# Patient Record
Sex: Male | Born: 1935 | Race: White | Hispanic: No | Marital: Married | Smoking: Former smoker
Health system: Southern US, Community
[De-identification: ages and names within clinical notes are randomized; demographics above are authoritative.]

## PROBLEM LIST (undated history)

## (undated) DIAGNOSIS — J449 Chronic obstructive pulmonary disease, unspecified: Secondary | ICD-10-CM

## (undated) DIAGNOSIS — J439 Emphysema, unspecified: Secondary | ICD-10-CM

## (undated) DIAGNOSIS — M5430 Sciatica, unspecified side: Secondary | ICD-10-CM

## (undated) DIAGNOSIS — I4891 Unspecified atrial fibrillation: Secondary | ICD-10-CM

## (undated) HISTORY — PX: OTHER SURGICAL HISTORY: SHX169

## (undated) HISTORY — PX: NEPHRECTOMY: SHX65

---

## 2013-12-07 ENCOUNTER — Emergency Department: Payer: Self-pay | Admitting: Emergency Medicine

## 2013-12-07 LAB — URINALYSIS, COMPLETE
Bilirubin,UR: NEGATIVE
Blood: NEGATIVE
Glucose,UR: NEGATIVE mg/dL (ref 0–75)
Nitrite: NEGATIVE
Ph: 5 (ref 4.5–8.0)
Protein: 30
RBC,UR: NONE SEEN /HPF (ref 0–5)
Specific Gravity: 1.015 (ref 1.003–1.030)
Squamous Epithelial: <1
WBC UR: 10 /HPF (ref 0–5)

## 2013-12-07 LAB — CBC WITH DIFFERENTIAL/PLATELET
BASOS ABS: 0 10*3/uL (ref 0.0–0.1)
BASOS PCT: 0.3 %
Eosinophil #: 0.2 10*3/uL (ref 0.0–0.7)
Eosinophil %: 1.5 %
HCT: 42.2 % (ref 40.0–52.0)
HGB: 13.7 g/dL (ref 13.0–18.0)
LYMPHS PCT: 12.6 %
Lymphocyte #: 1.4 10*3/uL (ref 1.0–3.6)
MCH: 33 pg (ref 26.0–34.0)
MCHC: 32.5 g/dL (ref 32.0–36.0)
MCV: 101 fL — ABNORMAL HIGH (ref 80–100)
Monocyte #: 1.5 x10 3/mm — ABNORMAL HIGH (ref 0.2–1.0)
Monocyte %: 13.1 %
Neutrophil #: 8.3 10*3/uL — ABNORMAL HIGH (ref 1.4–6.5)
Neutrophil %: 72.5 %
Platelet: 240 10*3/uL (ref 150–440)
RBC: 4.16 10*6/uL — ABNORMAL LOW (ref 4.40–5.90)
RDW: 17.8 % — ABNORMAL HIGH (ref 11.5–14.5)
WBC: 11.5 10*3/uL — ABNORMAL HIGH (ref 3.8–10.6)

## 2013-12-07 LAB — BASIC METABOLIC PANEL
ANION GAP: 10 (ref 7–16)
BUN: 25 mg/dL — ABNORMAL HIGH (ref 7–18)
CHLORIDE: 103 mmol/L (ref 98–107)
Calcium, Total: 9.8 mg/dL (ref 8.5–10.1)
Co2: 34 mmol/L — ABNORMAL HIGH (ref 21–32)
Creatinine: 1.1 mg/dL (ref 0.60–1.30)
EGFR (African American): >60
GLUCOSE: 90 mg/dL (ref 65–99)
Osmolality: 296 (ref 275–301)
Potassium: 4.4 mmol/L (ref 3.5–5.1)
SODIUM: 147 mmol/L — AB (ref 136–145)

## 2014-01-28 ENCOUNTER — Emergency Department: Payer: Self-pay | Admitting: Emergency Medicine

## 2015-01-06 ENCOUNTER — Encounter: Payer: Self-pay | Admitting: Emergency Medicine

## 2015-01-06 ENCOUNTER — Inpatient Hospital Stay
Admission: EM | Admit: 2015-01-06 | Discharge: 2015-01-08 | DRG: 871 | Disposition: A | Discharge status: Home Health | Payer: Medicare Other | Attending: Internal Medicine | Admitting: Internal Medicine

## 2015-01-06 ENCOUNTER — Emergency Department: Payer: Medicare Other

## 2015-01-06 DIAGNOSIS — J440 Chronic obstructive pulmonary disease with acute lower respiratory infection: Secondary | ICD-10-CM | POA: Diagnosis present

## 2015-01-06 DIAGNOSIS — I4891 Unspecified atrial fibrillation: Secondary | ICD-10-CM

## 2015-01-06 DIAGNOSIS — M5430 Sciatica, unspecified side: Secondary | ICD-10-CM | POA: Diagnosis present

## 2015-01-06 DIAGNOSIS — F0391 Unspecified dementia with behavioral disturbance: Secondary | ICD-10-CM | POA: Diagnosis present

## 2015-01-06 DIAGNOSIS — A419 Sepsis, unspecified organism: Secondary | ICD-10-CM | POA: Diagnosis present

## 2015-01-06 DIAGNOSIS — Z87891 Personal history of nicotine dependence: Secondary | ICD-10-CM | POA: Diagnosis not present

## 2015-01-06 DIAGNOSIS — D72829 Elevated white blood cell count, unspecified: Secondary | ICD-10-CM

## 2015-01-06 DIAGNOSIS — J181 Lobar pneumonia, unspecified organism: Secondary | ICD-10-CM

## 2015-01-06 DIAGNOSIS — Z79899 Other long term (current) drug therapy: Secondary | ICD-10-CM | POA: Diagnosis not present

## 2015-01-06 DIAGNOSIS — J189 Pneumonia, unspecified organism: Secondary | ICD-10-CM

## 2015-01-06 DIAGNOSIS — J439 Emphysema, unspecified: Secondary | ICD-10-CM

## 2015-01-06 DIAGNOSIS — R1310 Dysphagia, unspecified: Secondary | ICD-10-CM

## 2015-01-06 DIAGNOSIS — D649 Anemia, unspecified: Secondary | ICD-10-CM | POA: Diagnosis present

## 2015-01-06 DIAGNOSIS — F03918 Unspecified dementia, unspecified severity, with other behavioral disturbance: Secondary | ICD-10-CM

## 2015-01-06 HISTORY — DX: Unspecified atrial fibrillation: I48.91

## 2015-01-06 HISTORY — DX: Emphysema, unspecified: J43.9

## 2015-01-06 HISTORY — DX: Chronic obstructive pulmonary disease, unspecified: J44.9

## 2015-01-06 HISTORY — DX: Sciatica, unspecified side: M54.30

## 2015-01-06 LAB — URINALYSIS COMPLETE WITH MICROSCOPIC (ARMC ONLY)
Bacteria, UA: NONE SEEN
Bilirubin Urine: NEGATIVE
Glucose, UA: NEGATIVE mg/dL
HGB URINE DIPSTICK: NEGATIVE
KETONES UR: NEGATIVE mg/dL
LEUKOCYTES UA: NEGATIVE
NITRITE: NEGATIVE
PROTEIN: 100 mg/dL — AB
SPECIFIC GRAVITY, URINE: 1.017 (ref 1.005–1.030)
Squamous Epithelial / LPF: NONE SEEN
pH: 6 (ref 5.0–8.0)

## 2015-01-06 LAB — COMPREHENSIVE METABOLIC PANEL
ALT: 17 U/L (ref 17–63)
ANION GAP: 6 (ref 5–15)
AST: 28 U/L (ref 15–41)
Albumin: 3.7 g/dL (ref 3.5–5.0)
Alkaline Phosphatase: 59 U/L (ref 38–126)
BUN: 23 mg/dL — ABNORMAL HIGH (ref 6–20)
CALCIUM: 8.6 mg/dL — AB (ref 8.9–10.3)
CO2: 28 mmol/L (ref 22–32)
CREATININE: 0.99 mg/dL (ref 0.61–1.24)
Chloride: 103 mmol/L (ref 101–111)
Glucose, Bld: 101 mg/dL — ABNORMAL HIGH (ref 65–99)
Potassium: 3.8 mmol/L (ref 3.5–5.1)
Sodium: 137 mmol/L (ref 135–145)
Total Bilirubin: 0.7 mg/dL (ref 0.3–1.2)
Total Protein: 7.1 g/dL (ref 6.5–8.1)

## 2015-01-06 LAB — CBC WITH DIFFERENTIAL/PLATELET
BASOS ABS: 0 10*3/uL (ref 0–0.1)
BASOS PCT: 0 %
EOS ABS: 0 10*3/uL (ref 0–0.7)
EOS PCT: 0 %
HCT: 37.7 % — ABNORMAL LOW (ref 40.0–52.0)
HEMOGLOBIN: 12.6 g/dL — AB (ref 13.0–18.0)
Lymphocytes Relative: 6 %
Lymphs Abs: 0.7 10*3/uL — ABNORMAL LOW (ref 1.0–3.6)
MCH: 33.8 pg (ref 26.0–34.0)
MCHC: 33.4 g/dL (ref 32.0–36.0)
MCV: 101.3 fL — ABNORMAL HIGH (ref 80.0–100.0)
Monocytes Absolute: 1.7 10*3/uL — ABNORMAL HIGH (ref 0.2–1.0)
Monocytes Relative: 13 %
NEUTROS PCT: 81 %
Neutro Abs: 10.5 10*3/uL — ABNORMAL HIGH (ref 1.4–6.5)
PLATELETS: 171 10*3/uL (ref 150–440)
RBC: 3.72 MIL/uL — AB (ref 4.40–5.90)
RDW: 19 % — ABNORMAL HIGH (ref 11.5–14.5)
WBC: 13 10*3/uL — AB (ref 3.8–10.6)

## 2015-01-06 LAB — LACTIC ACID, PLASMA
Lactic Acid, Venous: 0.8 mmol/L (ref 0.5–2.0)
Lactic Acid, Venous: 0.4 mmol/L — ABNORMAL LOW (ref 0.5–2.0)

## 2015-01-06 LAB — TROPONIN I

## 2015-01-06 LAB — LIPASE, BLOOD: LIPASE: 35 U/L (ref 11–51)

## 2015-01-06 MED ORDER — SODIUM CHLORIDE 0.9 % IJ SOLN
3.0000 mL | Freq: Two times a day (BID) | INTRAMUSCULAR | Status: DC
  Administered 2015-01-06 – 2015-01-08 (×3): 3 mL via INTRAVENOUS

## 2015-01-06 MED ORDER — TRAMADOL HCL 50 MG PO TABS
50.0000 mg | ORAL_TABLET | Freq: Three times a day (TID) | ORAL | Status: DC | PRN
  Administered 2015-01-07 (×2): 50 mg via ORAL
  Filled 2015-01-06 (×2): qty 1

## 2015-01-06 MED ORDER — ENOXAPARIN SODIUM 40 MG/0.4ML ~~LOC~~ SOLN
40.0000 mg | SUBCUTANEOUS | Status: DC
  Administered 2015-01-06 – 2015-01-07 (×2): 40 mg via SUBCUTANEOUS
  Filled 2015-01-06 (×2): qty 0.4

## 2015-01-06 MED ORDER — DEXTROSE 5 % IV SOLN
1.0000 g | Freq: Every day | INTRAVENOUS | Status: DC
  Administered 2015-01-07 (×2): 1 g via INTRAVENOUS
  Filled 2015-01-06 (×3): qty 10

## 2015-01-06 MED ORDER — VITAMIN B-12 1000 MCG PO TABS
1000.0000 ug | ORAL_TABLET | Freq: Every day | ORAL | Status: DC
  Administered 2015-01-07 – 2015-01-08 (×2): 1000 ug via ORAL
  Filled 2015-01-06 (×2): qty 1

## 2015-01-06 MED ORDER — ADULT MULTIVITAMIN W/MINERALS CH
1.0000 | ORAL_TABLET | Freq: Every day | ORAL | Status: DC
  Administered 2015-01-07 – 2015-01-08 (×2): 1 via ORAL
  Filled 2015-01-06 (×2): qty 1

## 2015-01-06 MED ORDER — ACETAMINOPHEN 500 MG PO TABS
1000.0000 mg | ORAL_TABLET | ORAL | Status: AC
  Administered 2015-01-06: 1000 mg via ORAL
  Filled 2015-01-06: qty 2

## 2015-01-06 MED ORDER — DEXTROSE 5 % IV SOLN
500.0000 mg | INTRAVENOUS | Status: DC
  Administered 2015-01-06 – 2015-01-07 (×2): 500 mg via INTRAVENOUS
  Filled 2015-01-06 (×3): qty 500

## 2015-01-06 MED ORDER — DOCUSATE SODIUM 100 MG PO CAPS
100.0000 mg | ORAL_CAPSULE | Freq: Two times a day (BID) | ORAL | Status: DC | PRN
Start: 2015-01-06 — End: 2015-01-08

## 2015-01-06 MED ORDER — POLYETHYLENE GLYCOL 3350 17 G PO PACK: 17.0000 g | PACK | Freq: Every day | ORAL | Status: DC | PRN

## 2015-01-06 MED ORDER — GUAIFENESIN ER 600 MG PO TB12
600.0000 mg | ORAL_TABLET | Freq: Two times a day (BID) | ORAL | Status: DC
  Administered 2015-01-06 – 2015-01-08 (×4): 600 mg via ORAL
  Filled 2015-01-06 (×4): qty 1

## 2015-01-06 MED ORDER — ONDANSETRON HCL 4 MG PO TABS: 4.0000 mg | ORAL_TABLET | Freq: Four times a day (QID) | ORAL | Status: DC | PRN

## 2015-01-06 MED ORDER — PIPERACILLIN-TAZOBACTAM 3.375 G IVPB
3.3750 g | Freq: Once | INTRAVENOUS | Status: AC
  Administered 2015-01-06: 3.375 g via INTRAVENOUS
  Filled 2015-01-06: qty 50

## 2015-01-06 MED ORDER — TRAMADOL HCL 50 MG PO TABS
50.0000 mg | ORAL_TABLET | Freq: Every evening | ORAL | Status: DC | PRN
  Administered 2015-01-07: 50 mg via ORAL
  Filled 2015-01-06: qty 1

## 2015-01-06 MED ORDER — SODIUM CHLORIDE 0.9 % IV SOLN
INTRAVENOUS | Status: DC
  Administered 2015-01-06: 22:00:00 via INTRAVENOUS

## 2015-01-06 MED ORDER — SODIUM CHLORIDE 0.9 % IV BOLUS (SEPSIS)
1000.0000 mL | Freq: Once | INTRAVENOUS | Status: AC
  Administered 2015-01-06: 1000 mL via INTRAVENOUS

## 2015-01-06 MED ORDER — ACETAMINOPHEN 325 MG PO TABS: 650.0000 mg | ORAL_TABLET | Freq: Four times a day (QID) | ORAL | Status: DC | PRN

## 2015-01-06 MED ORDER — VANCOMYCIN HCL IN DEXTROSE 1-5 GM/200ML-% IV SOLN
1000.0000 mg | Freq: Once | INTRAVENOUS | Status: AC
  Administered 2015-01-06: 1000 mg via INTRAVENOUS
  Filled 2015-01-06: qty 200

## 2015-01-06 MED ORDER — IPRATROPIUM-ALBUTEROL 0.5-2.5 (3) MG/3ML IN SOLN: 3.0000 mL | Freq: Four times a day (QID) | RESPIRATORY_TRACT | Status: DC | PRN

## 2015-01-06 MED ORDER — ACETAMINOPHEN 650 MG RE SUPP: 650.0000 mg | Freq: Four times a day (QID) | RECTAL | Status: DC | PRN

## 2015-01-06 MED ORDER — TIOTROPIUM BROMIDE MONOHYDRATE 18 MCG IN CAPS
18.0000 ug | ORAL_CAPSULE | Freq: Every day | RESPIRATORY_TRACT | Status: DC
  Administered 2015-01-07 – 2015-01-08 (×2): 18 ug via RESPIRATORY_TRACT
  Filled 2015-01-06: qty 5

## 2015-01-06 MED ORDER — ONDANSETRON HCL 4 MG/2ML IJ SOLN: 4.0000 mg | Freq: Four times a day (QID) | INTRAMUSCULAR | Status: DC | PRN

## 2015-01-06 NOTE — ED Provider Notes (Signed)
[facility] Emergency Department Provider Note  REMINDER - THIS NOTE IS NOT A FINAL MEDICAL RECORD UNTIL IT IS SIGNED. UNTIL THEN, THE CONTENT BELOW MAY REFLECT INFORMATION FROM A DOCUMENTATION TEMPLATE, NOT THE ACTUAL PATIENT VISIT. ____________________________________________  Time seen: Approximately 6:12 PM  I have reviewed the triage vital signs and the nursing notes.   HISTORY  Chief Complaint Fever   HPI Donald Blanchard is a 82 y.o. male history of COPD, sciatica,presents today for feeling very weak. Patient reports that he's had a cough and is been difficult to stand for about the last day. She feels very dry and "dehydrated. He was told he had a fever and was sent failure further evaluation.  He denies any pain.  Past Medical History  Diagnosis Date  . COPD (chronic obstructive pulmonary disease) (HCC)   . Emphysema of lung (HCC)   . Sciatica   . Emphysema lung (HCC)     There are no active problems to display for this patient.   Past Surgical History  Procedure Laterality Date  . Lobectomy      Current Outpatient Rx  Name  Route  Sig  Dispense  Refill  . docusate sodium (COLACE) 100 MG capsule   Oral   Take 100 mg by mouth every 12 (twelve) hours as needed for mild constipation or moderate constipation.         . Multiple Vitamins-Minerals (MULTIVITAMIN WITH MINERALS) tablet   Oral   Take 1 tablet by mouth daily.         . polyethylene glycol (MIRALAX / GLYCOLAX) packet   Oral   Take 17 g by mouth daily as needed for mild constipation, moderate constipation or severe constipation. Mix in 8 oz water         . tiotropium (SPIRIVA) 18 MCG inhalation capsule   Inhalation   Place 18 mcg into inhaler and inhale daily.         . traMADol (ULTRAM) 50 MG tablet   Oral   Take 50 mg by mouth 3 (three) times daily.         . vitamin B-12 (CYANOCOBALAMIN) 1000 MCG tablet   Oral   Take 1,000 mcg by mouth daily.            Allergies Review of patient's allergies indicates no known allergies.  No family history on file.  Social History Social History  Substance Use Topics  . Smoking status: Former Smoker    Quit date: 01/02/2004  . Smokeless tobacco: None  . Alcohol Use: No    Review of Systems Constitutional: See history of present illness Eyes: No visual changes. ENT: No sore throat. Cardiovascular: Denies chest pain. Respiratory: Productive Gastrointestinal: No abdominal pain.  No nausea, no vomiting.  No diarrhea.  No constipation. Genitourinary: Negative for dysuria. Musculoskeletal: Negative for back pain. Skin: Negative for rash. Neurological: Negative for headaches, focal weakness or numbness.  10-point ROS otherwise negative.  ____________________________________________   PHYSICAL EXAM:  VITAL SIGNS: ED Triage Vitals  Enc Vitals Group     BP --      Pulse Rate 01/06/15 1648 70     Resp 01/06/15 1648 20     Temp 01/06/15 1714 101.2 F (38.4 C)     Temp Source 01/06/15 1714 Rectal     SpO2 01/06/15 1648 96 %     Weight 01/06/15 1648 135 lb (61.236 kg)     Height 01/06/15 1648 6\' 3"  (1.905 m)  Head Cir --      Peak Flow --      Pain Score --      Pain Loc --      Pain Edu? --      Excl. in GC? --    Constitutional: Alert and oriented. Mildly fatigued appearing but in no distress. Eyes: Conjunctivae are normal. PERRL. EOMI. Head: Atraumatic. Nose: No congestion/rhinnorhea. Mouth/Throat: Mucous membranes are moist.  Oropharynx non-erythematous. Neck: No stridor.   Cardiovascular: Normal rate, regular rhythm. Grossly normal heart sounds.  Good peripheral circulation. Respiratory: Normal respiratory effort.  No retractions. Lungs CTAB. Occasional dry cough. Gastrointestinal: Soft and nontender. No distention. No abdominal bruits. No CVA tenderness. Musculoskeletal: No lower extremity tenderness does have 2+ lower extremity edema bilateral.  No joint  effusions. Neurologic:  Normal speech and language. No gross focal neurologic deficits are appreciated. No gait instability. Skin:  Skin is warm, dry and intact. No rash noted. Psychiatric: Mood and affect are normal. Speech and behavior are normal.  ____________________________________________   LABS (all labs ordered are listed, but only abnormal results are displayed)  Labs Reviewed  CBC WITH DIFFERENTIAL/PLATELET - Abnormal; Notable for the following:    WBC 13.0 (*)    RBC 3.72 (*)    Hemoglobin 12.6 (*)    HCT 37.7 (*)    MCV 101.3 (*)    RDW 19.0 (*)    Neutro Abs 10.5 (*)    Lymphs Abs 0.7 (*)    Monocytes Absolute 1.7 (*)    All other components within normal limits  URINALYSIS COMPLETEWITH MICROSCOPIC ([facility] ONLY) - Abnormal; Notable for the following:    Color, Urine YELLOW (*)    APPearance HAZY (*)    Protein, ur 100 (*)    All other components within normal limits  COMPREHENSIVE METABOLIC PANEL - Abnormal; Notable for the following:    Glucose, Bld 101 (*)    BUN 23 (*)    Calcium 8.6 (*)    All other components within normal limits  CULTURE, BLOOD (ROUTINE X 2)  CULTURE, BLOOD (ROUTINE X 2)  LIPASE, BLOOD  TROPONIN I  LACTIC ACID, PLASMA  LACTIC ACID, PLASMA   ____________________________________________  EKG  Reviewed and interpreted by me at 1700 Normal sinus rhythm, heart rate 70 QRS 150 QTc 450 Normal sinus rhythm, occasional PVC with sinus pause. Old Q waves seen in anteroseptal distribution. There is minimal J-point elevation noted inferiorly, no morphology or changes to suggest acute ST elevation  The patient is chest pain-free at the time of this EKG. ____________________________________________  RADIOLOGY  DG Chest 2 View (Final result) Result time: 01/06/15 17:48:53   Final result by Rad Results In Interface (01/06/15 17:48:53)   Narrative:   CLINICAL DATA: 82 year old nursing home patient with acute onset of fever. Current  history of emphysema.  EXAM: CHEST 2 VIEW  COMPARISON: None.  FINDINGS: Cardiac silhouette normal in size. Thoracic aorta atherosclerotic and mildly tortuous. Hilar and mediastinal contours otherwise unremarkable. Lungs hyperinflated with diffuse emphysematous changes and likely interstitial fibrosis in the lower lobes and lingula. Patchy airspace opacities in the right upper lobe scattered calcified granulomata in both lungs. Degenerative changes involving the thoracic spine.  IMPRESSION: Acute right upper lobe pneumonia superimposed upon severe COPD/emphysema.   Electronically Signed By: Hulan Saas M.D. On: 01/06/2015 17:48       ____________________________________________   PROCEDURES  Procedure(s) performed: None  Critical Care performed: Yes, see critical care note(s)  ____________________________________________   INITIAL IMPRESSION /  ASSESSMENT AND PLAN / ED COURSE  Pertinent labs & imaging results that were available during my care of the patient were reviewed by me and considered in my medical decision making (see chart for details).  Patient presents with a fever, weakness, and cough. Chest x-ray appears to show an infiltrate in the right lower lobe. There is mention of a prior lobectomy, however patient does not recall where this was performed which lung. Based on his fever, leukocytosis, and cough I am concerned about sepsis with a pulmonary source be most likely. Initiate sepsis protocol. We will admit to the hospital. Wide spectrum antibiotics.  CRITICAL CARE Performed by: Sharyn Creamer   Total critical care time: 35 minutes  Critical care time was exclusive of separately billable procedures and treating other patients.  Critical care was necessary to treat or prevent imminent or life-threatening deterioration.  Critical care was time spent personally by me on the following activities: development of treatment plan with patient and/or  surrogate as well as nursing, discussions with consultants, evaluation of patient's response to treatment, examination of patient, obtaining history from patient or surrogate, ordering and performing treatments and interventions, ordering and review of laboratory studies, ordering and review of radiographic studies, pulse oximetry and re-evaluation of patient's condition.  Critical care includes early goal-directed therapy for acute sepsis.   Called received from patient's son-in-law, updated on plan of care. ____________________________________________   FINAL CLINICAL IMPRESSION(S) / ED DIAGNOSES  Final diagnoses:  Right upper lobe pneumonia  Sepsis, due to unspecified organism [facility])      Sharyn Creamer, MD 01/06/15 1940

## 2015-01-06 NOTE — ED Notes (Signed)
Pt brought in via EMS from [facility]; EMS reports MD was doing rounds and pt had fever.  EMS reports temporal fever of 101.7.  Pt A/O to self, location, reports that he feels "fine" but is weaker that normal.  Vitals WDL, no immediate distress at this time.

## 2015-01-06 NOTE — Progress Notes (Signed)
ANTIBIOTIC CONSULT NOTE - INITIAL  Pharmacy Consult for ceftriaxone Indication: pneumonia  No Known Allergies  Patient Measurements: Height: 6\' 3"  (190.5 cm) Weight: 135 lb (61.236 kg) IBW/kg (Calculated) : 84.5 Adjusted Body Weight:   Vital Signs: Temp: 97.9 F (36.6 C) (01/05 2210) Temp Source: Oral (01/05 2210) BP: 140/49 mmHg (01/05 2210) Pulse Rate: 55 (01/05 2210) Intake/Output from previous day:   Intake/Output from this shift:    Labs:  Recent Labs  01/06/15 1740  WBC 13.0*  HGB 12.6*  PLT 171  CREATININE 0.99   Estimated Creatinine Clearance: 49.8 mL/min (by C-G formula based on Cr of 0.99). No results for input(s): VANCOTROUGH, VANCOPEAK, VANCORANDOM, GENTTROUGH, GENTPEAK, GENTRANDOM, TOBRATROUGH, TOBRAPEAK, TOBRARND, AMIKACINPEAK, AMIKACINTROU, AMIKACIN in the last 72 hours.   Microbiology: No results found for this or any previous visit (from the past 720 hour(s)).  Medical History: Past Medical History  Diagnosis Date  . COPD (chronic obstructive pulmonary disease) (HCC)     not on home o2  . Emphysema of lung (HCC)   . Sciatica   . A-fib (HCC)     Medications:  Infusions:  . sodium chloride     Assessment: 84 yom cc fever with history of COPD/bullous emphysema. From ALF secondary to fever, cough, confusion. Pharmacy consulted to dose ceftriaxone for CAP.  Goal of Therapy:    Plan:  Ceftriaxone 1 gm IV Q24H. Pharmacy will continue to follow for duration and and conversion to PO.  Carola FrostNathan A Summit Arroyave, Pharm.D., BCPS Clinical Pharmacist 01/06/2015,10:14 PM

## 2015-01-06 NOTE — H&P (Addendum)
[facility] [place] [facility]   PATIENT NAME: Donald Blanchard    MR#:  [id number]  DATE OF BIRTH:  05-30-1930  DATE OF ADMISSION:  01/06/2015  PRIMARY CARE PHYSICIAN: Doctors making house calls  REQUESTING/REFERRING PHYSICIAN: Dr. Sharyn Creamer  CHIEF COMPLAINT:   Chief Complaint  Patient presents with  . Fever    HISTORY OF PRESENT ILLNESS:  Donald Blanchard  is a 82 y.o. male with a known history of COPD and bullous emphysema, prior history of smoking, sciatica with weakness in both lower extremities presents from assisted living facility secondary to fevers, cough and confusion. Patient lives at the assisted living facility with his wife who has dementia. He ambulates with the help of a walker due to his sciatica pain in both lower extremities. He was noticed to have trouble breathing and also coughing today. O2 to be more disoriented and also was running a fever at the facility. So he was sent to the emergency room. Patient denies any fevers or chills. He notices that he is much weaker than normal and unable to even walk today. Has an elevated white count. Chest x-ray with right upper lobe infiltrate.  PAST MEDICAL HISTORY:   Past Medical History  Diagnosis Date  . COPD (chronic obstructive pulmonary disease) (HCC)     not on home o2  . Emphysema of lung (HCC)   . Sciatica   . A-fib [facility])     PAST SURGICAL HISTORY:   Past Surgical History  Procedure Laterality Date  . Right lung lesion removal    . Nephrectomy      Left- secondary to trauma    SOCIAL HISTORY:   Social History  Substance Use Topics  . Smoking status: Former Smoker    Quit date: 01/02/2004  . Smokeless tobacco: Not on file     Comment: Quit 10 years ago  . Alcohol Use: No    FAMILY HISTORY:   Family History  Problem Relation Age of Onset  . Hypertension Father     DRUG ALLERGIES:  No Known Allergies  REVIEW OF SYSTEMS:   Review of Systems  Constitutional:  Positive for fever and malaise/fatigue. Negative for chills and weight loss.  HENT: Negative for ear discharge, ear pain, nosebleeds and tinnitus.   Eyes: Negative for blurred vision, double vision and photophobia.  Respiratory: Positive for cough and shortness of breath. Negative for hemoptysis and wheezing.   Cardiovascular: Negative for chest pain, palpitations, orthopnea and leg swelling.  Gastrointestinal: Negative for nausea, vomiting, abdominal pain, diarrhea, constipation and melena.  Genitourinary: Negative for dysuria, urgency, frequency and hematuria.  Musculoskeletal: Negative for myalgias, back pain and neck pain.  Skin: Negative for rash.  Neurological: Negative for dizziness, tremors, sensory change, speech change, focal weakness and headaches.       Some confusion today  Endo/Heme/Allergies: Does not bruise/bleed easily.  Psychiatric/Behavioral: Negative for depression.    MEDICATIONS AT HOME:   Prior to Admission medications   Medication Sig Start Date End Date Taking? Authorizing Provider  docusate sodium (COLACE) 100 MG capsule Take 100 mg by mouth every 12 (twelve) hours as needed for mild constipation or moderate constipation.   Yes Historical Provider, MD  Multiple Vitamins-Minerals (MULTIVITAMIN WITH MINERALS) tablet Take 1 tablet by mouth daily.   Yes Historical Provider, MD  polyethylene glycol (MIRALAX / GLYCOLAX) packet Take 17 g by mouth daily as needed for mild constipation, moderate constipation or severe constipation. Mix in 8 oz water  Yes Historical Provider, MD  tiotropium (SPIRIVA) 18 MCG inhalation capsule Place 18 mcg into inhaler and inhale daily.   Yes Historical Provider, MD  traMADol (ULTRAM) 50 MG tablet Take 50 mg by mouth 3 (three) times daily.   Yes Historical Provider, MD  vitamin B-12 (CYANOCOBALAMIN) 1000 MCG tablet Take 1,000 mcg by mouth daily.   Yes Historical Provider, MD      VITAL SIGNS:  Blood pressure 122/56, pulse 58, temperature  99.5 F (37.5 C), temperature source Rectal, resp. rate 14, height 6\' 3"  (1.905 m), weight 61.236 kg (135 lb), SpO2 98 %.  PHYSICAL EXAMINATION:   Physical Exam  GENERAL:  82 y.o.-year-old elderly patient sitting in the bed with no acute distress.  EYES: Pupils equal, round, reactive to light and accommodation. No scleral icterus. Extraocular muscles intact.  HEENT: Head atraumatic, normocephalic. Oropharynx and nasopharynx clear.  NECK:  Supple, no jugular venous distention. No thyroid enlargement, no tenderness.  LUNGS: Moving air bilaterally, occasional scant breath sounds. Scattered rhonchi on the right side. no wheezing, rales or crepitation. No use of accessory muscles of respiration.  CARDIOVASCULAR: S1, S2 normal. No rubs, or gallops. 3/6 systolic murmur is present. Irregular rhythm noted. ABDOMEN: Soft, nontender, nondistended. Bowel sounds present. No organomegaly or mass.  EXTREMITIES: No pedal edema, cyanosis, or clubbing.  NEUROLOGIC: Cranial nerves II through XII are intact. Muscle strength 5/5 in all extremities. Sensation intact. Gait not checked.  PSYCHIATRIC: The patient is alert and oriented x 2-3.  SKIN: No obvious rash, lesion, or ulcer.   LABORATORY PANEL:   CBC  Recent Labs Lab 01/06/15 1740  WBC 13.0*  HGB 12.6*  HCT 37.7*  PLT 171   ------------------------------------------------------------------------------------------------------------------  Chemistries   Recent Labs Lab 01/06/15 1740  NA 137  K 3.8  CL 103  CO2 28  GLUCOSE 101*  BUN 23*  CREATININE 0.99  CALCIUM 8.6*  AST 28  ALT 17  ALKPHOS 59  BILITOT 0.7   ------------------------------------------------------------------------------------------------------------------  Cardiac Enzymes  Recent Labs Lab 01/06/15 1740  TROPONINI <0.03   ------------------------------------------------------------------------------------------------------------------  RADIOLOGY:  Dg Chest 2  View  01/06/2015  CLINICAL DATA:  82 year old nursing home patient with acute onset of fever. Current history of emphysema. EXAM: CHEST  2 VIEW COMPARISON:  None. FINDINGS: Cardiac silhouette normal in size. Thoracic aorta atherosclerotic and mildly tortuous. Hilar and mediastinal contours otherwise unremarkable. Lungs hyperinflated with diffuse emphysematous changes and likely interstitial fibrosis in the lower lobes and lingula. Patchy airspace opacities in the right upper lobe scattered calcified granulomata in both lungs. Degenerative changes involving the thoracic spine. IMPRESSION: Acute right upper lobe pneumonia superimposed upon severe COPD/emphysema. Electronically Signed   By: Hulan Saas M.D.   On: 01/06/2015 17:48    EKG:   Orders placed or performed during the hospital encounter of 01/06/15  . ED EKG  . ED EKG    IMPRESSION AND PLAN:   Donald Blanchard  is a 82 y.o. male with a known history of COPD and bullous emphysema, prior history of smoking, sciatica with weakness in both lower extremities presents from assisted living facility secondary to fevers, cough and confusion.  #1 sepsis-secondary to community-acquired pneumonia. -Blood cultures ordered. -Start on Rocephin and azithromycin. -Currently not requiring any oxygen. Continue to monitor. - Mucinex added for cough  #2 COPD-stable at this time. Takes only Spiriva at home. Continue that.  #3 sciatica pain-continue tramadol as needed. -Physical therapy consult for weakness.  #4 DVT prophylaxis-on Lovenox  #5 A. fib-doesn't  take any medications at home. Rate controlled. -Not on any anticoagulation.    All the records are reviewed and case discussed with ED provider. Management plans discussed with the patient, family and they are in agreement.  CODE STATUS: Full Code  TOTAL TIME TAKING CARE OF THIS PATIENT: 50 minutes.    Enid BaasKALISETTI,Francetta Ilg M.D on 01/06/2015 at 8:32 PM  Between 7am to 6pm - Pager -  [phone]  After 6pm go to [email or web address] - password [email or web address] [facility]  [facility]  Office  [phone]  CC: Primary care physician; No primary care provider on file.

## 2015-01-07 LAB — CBC
HEMATOCRIT: 33 % — AB (ref 40.0–52.0)
Hemoglobin: 11 g/dL — ABNORMAL LOW (ref 13.0–18.0)
MCH: 33.3 pg (ref 26.0–34.0)
MCHC: 33.5 g/dL (ref 32.0–36.0)
MCV: 99.6 fL (ref 80.0–100.0)
Platelets: 153 10*3/uL (ref 150–440)
RBC: 3.31 MIL/uL — AB (ref 4.40–5.90)
RDW: 18.5 % — ABNORMAL HIGH (ref 11.5–14.5)
WBC: 9.3 10*3/uL (ref 3.8–10.6)

## 2015-01-07 LAB — BASIC METABOLIC PANEL
Anion gap: 5 (ref 5–15)
BUN: 19 mg/dL (ref 6–20)
CHLORIDE: 104 mmol/L (ref 101–111)
CO2: 26 mmol/L (ref 22–32)
Calcium: 8 mg/dL — ABNORMAL LOW (ref 8.9–10.3)
Creatinine, Ser: 0.61 mg/dL (ref 0.61–1.24)
GFR calc non Af Amer: >60 mL/min (ref >60)
Glucose, Bld: 77 mg/dL (ref 65–99)
POTASSIUM: 3.5 mmol/L (ref 3.5–5.1)
SODIUM: 135 mmol/L (ref 135–145)

## 2015-01-07 MED ORDER — HALOPERIDOL LACTATE 5 MG/ML IJ SOLN
2.5000 mg | INTRAMUSCULAR | Status: DC | PRN
  Administered 2015-01-07 – 2015-01-08 (×2): 2.5 mg via INTRAVENOUS
  Filled 2015-01-07 (×3): qty 1

## 2015-01-07 NOTE — Care Management Note (Signed)
Case Management Note  Patient Details  Name: Donald Blanchard MRN: [id number] Date of Birth: 09/30/1930  Subjective/Objective:            Patient suffers from neuropathy which impairs their ability to perform daily activities like toileting in the home. A walker will not resolve  issue with performing activities of daily living. A wheelchair will allow patient to safely perform daily activities. Patient can safely propel the wheelchair in the home or has a caregiver who can provide assistance.  Accessories: elevating leg rests (ELRs), wheel locks, extensions and anti-tippers.             Action/Plan:   Expected Discharge Date:  01/10/15               Expected Discharge Plan:     In-House Referral:     Discharge planning Services     Post Acute Care Choice:    Choice offered to:     DME Arranged:    DME Agency:     HH Arranged:    HH Agency:     Status of Service:     Medicare Important Message Given:  Yes Date Medicare IM Given:    Medicare IM give by:    Date Additional Medicare IM Given:    Additional Medicare Important Message give by:     If discussed at Long Length of Stay Meetings, dates discussed:    Additional Comments:  Tishawn Friedhoff A, RN 01/07/2015, 4:46 PM

## 2015-01-07 NOTE — Evaluation (Signed)
Physical Therapy Evaluation Patient Details Name: Collen Vincent MRN: [id number] DOB: 1930-06-20 Today's Date: 01/07/2015   History of Present Illness  Patient is an 82 y/o male that presents with R upper lobe pneumonia and sepsis. He has been having an increasingly difficult time ambulating secondary to feeling of weakness.   Clinical Impression  Patient demonstrates decreased bed mobility, sitting balance, transfers, and ambulation tolerance. He presents as a very high falls risk in this session and would benefit from short term rehabilitation to increase his strength and independence with mobility prior to return home. Patient demonstrates mild confusion and significant reduction in gait speed during this session. Significant kyphosis noted, which he states eases his long standing bilateral LE sciatic pain. No strength deficits noted in MMT, however he appears globally weak and deconditioned as he requires assistance with bed mobility and transfers and fatigues quickly. Skilled acute PT services are indicated to address the mobility deficits listed.     Follow Up Recommendations SNF    Equipment Recommendations       Recommendations for Other Services       Precautions / Restrictions Precautions Precautions: Fall Restrictions Weight Bearing Restrictions: No      Mobility  Bed Mobility Overal bed mobility: Needs Assistance Bed Mobility: Supine to Sit     Supine to sit: Min assist;Mod assist     General bed mobility comments: Patient requires cuing for hand placement and assistance with bringing LEs off the edge of the bed. Patient requires significant assistance with elevating trunk off the bed.   Transfers Overall transfer level: Needs assistance Equipment used: Rolling walker (2 wheeled) Transfers: Sit to/from Stand Sit to Stand: Min assist         General transfer comment: Patient requires cuing for hand placement, anterior trunk lean, and pushing through forefoot  (initially stands on heels and is off balance).   Ambulation/Gait Ambulation/Gait assistance: Min guard Ambulation Distance (Feet): 7 Feet Assistive device: Rolling walker (2 wheeled) Gait Pattern/deviations: Step-through pattern;Decreased step length - right;Decreased step length - left;Decreased dorsiflexion - right;Trunk flexed;Wide base of support   Gait velocity interpretation: Below normal speed for age/gender General Gait Details: Patient demonstrates significant trunkal flexion and wide base of support with requirements for cuing for each step ("bring your left foot forward"). No buckling noted, though very high risk of falling currently.   Stairs            Wheelchair Mobility    Modified Rankin (Stroke Patients Only)       Balance Overall balance assessment: Needs assistance Sitting-balance support: Bilateral upper extremity supported Sitting balance-Leahy Scale: Fair Sitting balance - Comments: Initially patient leans posteriorly in sitting, once given assistance for hand holds he is able to maintain upright without assistance.  Postural control: Posterior lean   Standing balance-Leahy Scale: Poor Standing balance comment: Patient demonstrates significantly decreased gait speed and flexed trunk indicating a very high falls risk.                              Pertinent Vitals/Pain Pain Assessment: No/denies pain    Home Living Family/patient expects to be discharged to:: Assisted living               Home Equipment: Walker - 2 wheels      Prior Function Level of Independence: Independent with assistive device(s)         Comments: Patient has typically been ambulating with RW,  though increasing difficulty recently.      Hand Dominance        Extremity/Trunk Assessment   Upper Extremity Assessment: Overall WFL for tasks assessed           Lower Extremity Assessment: Overall WFL for tasks assessed      Cervical / Trunk  Assessment: Kyphotic  Communication   Communication: No difficulties  Cognition Arousal/Alertness: Awake/alert Behavior During Therapy: WFL for tasks assessed/performed Overall Cognitive Status: Within Functional Limits for tasks assessed (Appears very mildly confused. Able to name month and year and recall names of grandchildren)                      General Comments      Exercises        Assessment/Plan    PT Assessment Patient needs continued PT services  PT Diagnosis Difficulty walking;Generalized weakness   PT Problem List Decreased strength;Decreased mobility;Decreased safety awareness;Decreased activity tolerance;Decreased balance  PT Treatment Interventions Gait training;DME instruction;Balance training;Therapeutic exercise;Therapeutic activities   PT Goals (Current goals can be found in the Care Plan section) Acute Rehab PT Goals Patient Stated Goal: To return home to his wife at ALF PT Goal Formulation: With patient/family Time For Goal Achievement: 01/21/15 Potential to Achieve Goals: Good    Frequency Min 2X/week   Barriers to discharge        Co-evaluation               End of Session Equipment Utilized During Treatment: Gait belt Activity Tolerance: Patient tolerated treatment well;Patient limited by fatigue Patient left: in chair;with family/visitor present;with call bell/phone within reach;with chair alarm set Nurse Communication: Mobility status         Time: [id number] PT Time Calculation (min) (ACUTE ONLY): 25 min   Charges:   PT Evaluation $PT Eval Low Complexity: 1 Procedure PT Treatments $Therapeutic Activity: 8-22 mins (Changed diaper, linens, and gown as they were soiled.)   PT G Codes:        Alva Garnetatrick McNamara 01/07/2015, 12:27 PM

## 2015-01-07 NOTE — NC FL2 (Signed)
[place] MEDICAID FL2 LEVEL OF CARE SCREENING TOOL     IDENTIFICATION  Patient Name: Donald Blanchard Birthdate: 05/19/1930 Sex: male Admission Date (Current Location): 01/06/2015  [facility] and [place] Number:  Chiropodist and Address:  [facility], [address] [place], [place] [address]      Provider Number: [id number]  Attending Physician Name and Address:  Houston Siren, MD  Relative Name and Phone Number:       Current Level of Care: Hospital Recommended Level of Care: Assisted Living Facility Prior Approval Number:    Date Approved/Denied:   PASRR Number:  [id number])  Discharge Plan: Domiciliary (Rest home)    Current Diagnoses: Patient Active Problem List   Diagnosis Date Noted  . Sepsis (HCC) 01/06/2015    Orientation RESPIRATION BLADDER Height & Weight    Self, Situation, Place  Normal Incontinent 6\' 3"  (190.5 cm) 152 lbs.  BEHAVIORAL SYMPTOMS/MOOD NEUROLOGICAL BOWEL NUTRITION STATUS   (none )  (none ) Continent Diet: dysphagia 2 with thin liquids,  AMBULATORY STATUS COMMUNICATION OF NEEDS Skin   Extensive Assist Verbally Normal                       Personal Care Assistance Level of Assistance  Bathing, Feeding, Dressing Bathing Assistance: Limited assistance Feeding assistance: Independent Dressing Assistance: Limited assistance     Functional Limitations Info  Sight, Hearing, Speech Sight Info: Adequate Hearing Info: Adequate Speech Info: Adequate    SPECIAL CARE FACTORS FREQUENCY  PT (By licensed PT)     PT Frequency:  (3)              Contractures      Additional Factors Info  Code Status Code Status Info:  (Full Code. )             Current Medications (01/07/2015):  This is the current hospital active medication list Current Facility-Administered Medications  Medication Dose Route Frequency Provider Last Rate Last Dose  . acetaminophen (TYLENOL) tablet 650 mg  650 mg Oral Q6H  PRN Enid Baas, MD       Or  . acetaminophen (TYLENOL) suppository 650 mg  650 mg Rectal Q6H PRN Enid Baas, MD      . azithromycin (ZITHROMAX) 500 mg in dextrose 5 % 250 mL IVPB  500 mg Intravenous Q24H Enid Baas, MD   500 mg at 01/06/15 2255  . cefTRIAXone (ROCEPHIN) 1 g in dextrose 5 % 50 mL IVPB  1 g Intravenous Daily Enid Baas, MD   1 g at 01/07/15 0149  . docusate sodium (COLACE) capsule 100 mg  100 mg Oral Q12H PRN Enid Baas, MD      . enoxaparin (LOVENOX) injection 40 mg  40 mg Subcutaneous Q24H Enid Baas, MD   40 mg at 01/06/15 2254  . guaiFENesin (MUCINEX) 12 hr tablet 600 mg  600 mg Oral BID Enid Baas, MD   600 mg at 01/07/15 0802  . haloperidol lactate (HALDOL) injection 2.5 mg  2.5 mg Intravenous Q4H PRN Houston Siren, MD      . ipratropium-albuterol (DUONEB) 0.5-2.5 (3) MG/3ML nebulizer solution 3 mL  3 mL Nebulization Q6H PRN Enid Baas, MD      . multivitamin with minerals tablet 1 tablet  1 tablet Oral Daily Enid Baas, MD   1 tablet at 01/07/15 0802  . ondansetron (ZOFRAN) tablet 4 mg  4 mg Oral Q6H PRN Enid Baas, MD  Or  . ondansetron (ZOFRAN) injection 4 mg  4 mg Intravenous Q6H PRN Enid Baasadhika Kalisetti, MD      . polyethylene glycol (MIRALAX / GLYCOLAX) packet 17 g  17 g Oral Daily PRN Enid Baasadhika Kalisetti, MD      . sodium chloride 0.9 % injection 3 mL  3 mL Intravenous Q12H Enid Baasadhika Kalisetti, MD   3 mL at 01/07/15 0802  . tiotropium (SPIRIVA) inhalation capsule 18 mcg  18 mcg Inhalation Daily Enid Baasadhika Kalisetti, MD   18 mcg at 01/07/15 0801  . traMADol (ULTRAM) tablet 50 mg  50 mg Oral TID PRN Enid Baasadhika Kalisetti, MD   50 mg at 01/07/15 1040  . vitamin B-12 (CYANOCOBALAMIN) tablet 1,000 mcg  1,000 mcg Oral Daily Enid Baasadhika Kalisetti, MD   1,000 mcg at 01/07/15 0802     Discharge Medications: Current Discharge Medication List    START taking these medications   Details  guaiFENesin (MUCINEX)  600 MG 12 hr tablet Take 1 tablet (600 mg total) by mouth 2 (two) times daily. Qty: 20 tablet, Refills: 0    ipratropium-albuterol (DUONEB) 0.5-2.5 (3) MG/3ML SOLN Take 3 mLs by nebulization every 6 (six) hours as needed. Qty: 360 mL, Refills: 5    levofloxacin (LEVAQUIN) 750 MG tablet Take 1 tablet (750 mg total) by mouth every other day. Qty: 4 tablet, Refills: 0      CONTINUE these medications which have NOT CHANGED   Details  docusate sodium (COLACE) 100 MG capsule Take 100 mg by mouth every 12 (twelve) hours as needed for mild constipation or moderate constipation.    Multiple Vitamins-Minerals (MULTIVITAMIN WITH MINERALS) tablet Take 1 tablet by mouth daily.    polyethylene glycol (MIRALAX / GLYCOLAX) packet Take 17 g by mouth daily as needed for mild constipation, moderate constipation or severe constipation. Mix in 8 oz water    tiotropium (SPIRIVA) 18 MCG inhalation capsule Place 18 mcg into inhaler and inhale daily.    traMADol (ULTRAM) 50 MG tablet Take 50 mg by mouth 3 (three) times daily.    vitamin B-12 (CYANOCOBALAMIN) 1000 MCG tablet Take 1,000 mcg by mouth daily.         Generalized weakness . Home with home health with PT  Suspected dysphagia done great. Patient's diet to dysphagia 2 with thin liquids, patient is to follow-up with home health speech therapy and make decisions about modified swallowing study if needed  Relevant Imaging Results:  Relevant Lab Results:   Additional Information  (SSN: [id number])  Haig ProphetMorgan, Bailey G, LCSW

## 2015-01-07 NOTE — Progress Notes (Signed)
[facility] [place] [facility]   PATIENT NAME: Donald Blanchard    MR#:  [id number]  DATE OF BIRTH:  [birth year]  SUBJECTIVE:   Pt. Here due to fever and suspected sepsis from pneumonia. Sitting in chair eating lunch but in no apparent distress. Family at bedside. No cough, fever, chills or nausea vomiting.   REVIEW OF SYSTEMS:    Review of Systems  Constitutional: Negative for fever and chills.  HENT: Negative for congestion and tinnitus.   Eyes: Negative for blurred vision and double vision.  Respiratory: Negative for cough, shortness of breath and wheezing.   Cardiovascular: Negative for chest pain, orthopnea and PND.  Gastrointestinal: Negative for nausea, vomiting, abdominal pain and diarrhea.  Genitourinary: Negative for dysuria and hematuria.  Neurological: Positive for weakness. Negative for dizziness, sensory change and focal weakness.  All other systems reviewed and are negative.   Nutrition: Heart healthy Tolerating Diet: Yes Tolerating PT: Await evaluation   DRUG ALLERGIES:  No Known Allergies  VITALS:  Blood pressure 134/61, pulse 67, temperature 98.7 F (37.1 C), temperature source Oral, resp. rate 18, height 6\' 3"  (1.905 m), weight 61.236 kg (135 lb), SpO2 95 %.  PHYSICAL EXAMINATION:   Physical Exam  GENERAL:  82 y.o.-year-old patient sitting up in chair in no acute distress.  EYES: Pupils equal, round, reactive to light and accommodation. No scleral icterus. Extraocular muscles intact.  HEENT: Head atraumatic, normocephalic. Oropharynx and nasopharynx clear.  NECK:  Supple, no jugular venous distention. No thyroid enlargement, no tenderness.  LUNGS: Poor respiratory effort, no wheezing, rales, rhonchi. No use of accessory muscles of respiration.  CARDIOVASCULAR: S1, S2 normal. No murmurs, rubs, or gallops.  ABDOMEN: Soft, nontender, nondistended. Bowel sounds present. No organomegaly or mass.  EXTREMITIES: No cyanosis,  clubbing or edema b/l.    NEUROLOGIC: Cranial nerves II through XII are intact. No focal Motor or sensory deficits b/l. Globally weak   PSYCHIATRIC: The patient is alert and oriented x 2. Dementia SKIN: No obvious rash, lesion, or ulcer.    LABORATORY PANEL:   CBC  Recent Labs Lab 01/07/15 0457  WBC 9.3  HGB 11.0*  HCT 33.0*  PLT 153   ------------------------------------------------------------------------------------------------------------------  Chemistries   Recent Labs Lab 01/06/15 1740 01/07/15 0457  NA 137 135  K 3.8 3.5  CL 103 104  CO2 28 26  GLUCOSE 101* 77  BUN 23* 19  CREATININE 0.99 0.61  CALCIUM 8.6* 8.0*  AST 28  --   ALT 17  --   ALKPHOS 59  --   BILITOT 0.7  --    ------------------------------------------------------------------------------------------------------------------  Cardiac Enzymes  Recent Labs Lab 01/06/15 1740  TROPONINI <0.03   ------------------------------------------------------------------------------------------------------------------  RADIOLOGY:  Dg Chest 2 View  01/06/2015  CLINICAL DATA:  82 year old nursing home patient with acute onset of fever. Current history of emphysema. EXAM: CHEST  2 VIEW COMPARISON:  None. FINDINGS: Cardiac silhouette normal in size. Thoracic aorta atherosclerotic and mildly tortuous. Hilar and mediastinal contours otherwise unremarkable. Lungs hyperinflated with diffuse emphysematous changes and likely interstitial fibrosis in the lower lobes and lingula. Patchy airspace opacities in the right upper lobe scattered calcified granulomata in both lungs. Degenerative changes involving the thoracic spine. IMPRESSION: Acute right upper lobe pneumonia superimposed upon severe COPD/emphysema. Electronically Signed   By: Hulan Saas M.D.   On: 01/06/2015 17:48     ASSESSMENT AND PLAN:   Donald Blanchard is a 82 y.o. male with a known history of COPD and  bullous emphysema, prior history of smoking,  sciatica with weakness in both lower extremities presents from assisted living facility secondary to fevers, cough and confusion.  #1 sepsis-secondary to community-acquired pneumonia. - cont. Rocephin, Zithromax. Follow cultures which are (-) so far.  - cont. Mucinex.  Afebrile overnight.   #2 COPD-cont. Spiriva.  No acute exacerbation.   - cont. duonebs pRN.   #3 sciatica pain-continue tramadol as needed. - Await PT eval.    #5 A. fib-doesn't take any medications at home. Rate controlled. -Not on any anticoagulation as has dementia and a high fall risk.   #6 Dementia w/ some behavioral disturbance - pt. Had some episodes of agitation this afternoon.  - PrN haldol.  Avoid Benzo's.    Await PT eval.   All the records are reviewed and case discussed with Care Management/Social Workerr. Management plans discussed with the patient, family and they are in agreement.  CODE STATUS: Full  DVT Prophylaxis: Lovenox  TOTAL TIME TAKING CARE OF THIS PATIENT: 30 minutes.   POSSIBLE D/C IN 1-2 DAYS, DEPENDING ON CLINICAL CONDITION.   Houston SirenSAINANI,Allure Greaser J M.D on 01/07/2015 at 3:22 PM  Between 7am to 6pm - Pager - [phone]  After 6pm go to [email or web address] - password [email or web address]  [facility]  Office  [phone]  CC: Primary care physician; No primary care provider on file.

## 2015-01-07 NOTE — Clinical Social Work Note (Addendum)
Clinical Social Work Assessment  Patient Details  Name: Donald LeveringJohn Roa MRN: [id number] Date of Birth: 12/20/1930  Date of referral:  01/07/15               Reason for consult:  Facility Placement, Other (Comment Required) (From [facility] )                Permission sought to share information with:  Facility Industrial/product designerContact Representative Permission granted to share information::  Yes, Verbal Permission Granted  Name::        Agency::     Relationship::     Contact Information:     Housing/Transportation Living arrangements for the past 2 months:  Assisted Living Facility Source of Information:  Adult Children, Power of Attorney Patient Interpreter Needed:  None Criminal Activity/Legal Involvement Pertinent to Current Situation/Hospitalization:  No - Comment as needed Significant Relationships:  Adult Children, Spouse Lives with:  Facility Resident, Spouse Do you feel safe going back to the place where you live?  Yes Need for family participation in patient care:  Yes (Comment)  Care giving concerns:  [facility] is a [facility] [phone] [place].    Social Worker assessment / plan: Visual merchandiserClinical Social Worker (CSW) contacted patient's daughter Berton BonCynthia Tate [phone]. Per daughter [facility]. Per daughter [facility]. Per daughter she is HPOA. CSW explained that PT is recommending SNF. Daughter reported that she prefers patient [facility] and get PT there. CSW contacted Liana Crockerourtney [facility] RN. Per Toni Amendourtney patient can return if he has a wheel chair. Per Toni Amendourtney patient usually walks with a walker. Per Toni Amendourtney patient is private pay at ALF. CSW made Toni AmendCourtney aware that patient may D/C back [facility] over the weekend. Daughter reported that depending on the weather over the weekend family may provide transport or do EMS.   CSW contacted daughter again and made her aware that patient will  require a wheel chair. Per daughter she can supply a wheel chair from her office today and bring it to [facility]. Daughter requested for RN Case Manager to call her with prices of wheel chair. RN Case Manager is working on arranging home health and getting wheel chair.  FL2 complete. CSW can fax D/C Summary to [facility] at [phone]. CSW will continue to follow and assist as needed.     Employment status:  Disabled (Comment on whether or not currently receiving Disability), Retired Database administratornsurance information:  Managed Medicare PT Recommendations:  Skilled Nursing Facility Information / Referral to community resources:  Other (Comment Required) (Assisted Living Facility )  Patient/Family's Response to care: Daughter is in agreement with patient to return [facility].   Patient/Family's Understanding of and Emotional Response to Diagnosis, Current Treatment, and Prognosis: Daughter was pleasant throughout assessment and thanked CSW for calling.   Emotional Assessment Appearance:    Attitude/Demeanor/Rapport:  Unable to Assess Affect (typically observed):  Unable to Assess Orientation:  Oriented to Self, Oriented to Place, Fluctuating Orientation (Suspected and/or reported Sundowners) Alcohol / Substance use:  Not Applicable Psych involvement (Current and /or in the community):  No (Comment)  Discharge Needs  Concerns to be addressed:  Discharge Planning Concerns Readmission within the last 30 days:  No Current discharge risk:  Chronically ill Barriers to Discharge:  Continued Medical Work up   Haig ProphetMorgan, Mong Neal G, LCSW 01/07/2015, 3:32 PM

## 2015-01-07 NOTE — Care Management Important Message (Signed)
Important Message  Patient Details  Name: Donald LeveringJohn Blanchard MRN: [id number] Date of Birth: 04/16/1930   Medicare Important Message Given:  Yes    Kerney Hopfensperger A, RN 01/07/2015, 1:13 PM

## 2015-01-08 DIAGNOSIS — J439 Emphysema, unspecified: Secondary | ICD-10-CM

## 2015-01-08 DIAGNOSIS — R1310 Dysphagia, unspecified: Secondary | ICD-10-CM

## 2015-01-08 DIAGNOSIS — D649 Anemia, unspecified: Secondary | ICD-10-CM

## 2015-01-08 DIAGNOSIS — D72829 Elevated white blood cell count, unspecified: Secondary | ICD-10-CM

## 2015-01-08 DIAGNOSIS — F03918 Unspecified dementia, unspecified severity, with other behavioral disturbance: Secondary | ICD-10-CM

## 2015-01-08 DIAGNOSIS — F0391 Unspecified dementia with behavioral disturbance: Secondary | ICD-10-CM

## 2015-01-08 DIAGNOSIS — I4891 Unspecified atrial fibrillation: Secondary | ICD-10-CM

## 2015-01-08 DIAGNOSIS — J189 Pneumonia, unspecified organism: Secondary | ICD-10-CM

## 2015-01-08 DIAGNOSIS — J181 Lobar pneumonia, unspecified organism: Secondary | ICD-10-CM

## 2015-01-08 MED ORDER — GUAIFENESIN ER 600 MG PO TB12: 600.0000 mg | ORAL_TABLET | Freq: Two times a day (BID) | ORAL | Status: DC

## 2015-01-08 MED ORDER — LEVOFLOXACIN 750 MG PO TABS: 750.0000 mg | ORAL_TABLET | ORAL | Status: DC

## 2015-01-08 MED ORDER — HALOPERIDOL 1 MG PO TABS: 1.0000 mg | ORAL_TABLET | Freq: Three times a day (TID) | ORAL | Status: DC | PRN

## 2015-01-08 MED ORDER — IPRATROPIUM-ALBUTEROL 0.5-2.5 (3) MG/3ML IN SOLN: 3.0000 mL | Freq: Four times a day (QID) | RESPIRATORY_TRACT | Status: DC | PRN

## 2015-01-08 NOTE — Progress Notes (Signed)
Clinical Social Worker informed by Katharina Caperima Vaickute,  MD that patient is medically ready to [facility], Patient daugher Aram BeechamCynthia is in a agreement with plan and will transport him back.  Call [facility] to inform them patient is ready to return.  Provided  number to call for report [phone] . All discharge information faxed to  Facility. Rx's added to discharge packet with order for Wheel chair provided by RN care Production designer, theatre/television/filmmanager.   RN will call report and patient will [facility] via his daughter.  Sammuel Hineseborah Latia Mataya. LCSWA Clinical Social Work Department [phone] 11:39 AM

## 2015-01-08 NOTE — Evaluation (Signed)
Clinical/Bedside Swallow Evaluation Patient Details  Name: Donald Blanchard MRN: [id number] Date of Birth: 1930/11/04  Today's Date: 01/08/2015 Time: SLP Start Time (ACUTE ONLY): 1100 SLP Stop Time (ACUTE ONLY): 1200 SLP Time Calculation (min) (ACUTE ONLY): 60 min  Past Medical History:  Past Medical History  Diagnosis Date  . COPD (chronic obstructive pulmonary disease) (HCC)     not on home o2  . Emphysema of lung (HCC)   . Sciatica   . A-fib [facility])    Past Surgical History:  Past Surgical History  Procedure Laterality Date  . Right lung lesion removal    . Nephrectomy      Left- secondary to trauma   HPI:  Pt is a 82 y.o. male with a known history of lobar pneumonias, lobectomy of lung, COPD, and bullous emphysema, prior history of smoking, sciatica with weakness in both lower extremities presents from assisted living facility secondary to fevers, cough and confusion. Pt awakened and accepted sips of thin liquids w/ SLP but refused food trials stating he was "going home today". NSG did not have any concerns of his swallowing.   Assessment / Plan / Recommendation Clinical Impression  Pt appeared to tolerate trials of thin lqiuids via cup and straw w/ no immediate, overt s/s of aspiration noted. Pt's vocal quality was clear b/t trials and no decline in respiratory status was noted during/post trials. Oral phase adequate for bolus management of thin liquids. Pt was assisted in cup support initially then he requested(strongly) to use a straw to drink the coffee. Pt declined trials of foods to assess solids. Pt appears at reduced risk for aspiration following general aspiration precautions but requires monitoring for support and positioning upright in bed for po's d/t Cognitive decline/status. Pt is d/t discharge today. If he does not, then ST services will f/u w/ toleration of diet and trials to upgrade to Dys. 3 hopefully. NSG and CM updated.     Aspiration Risk  Mild aspiration risk (d/t  Cognitive status and baseline respiratory status)    Diet Recommendation  continue w/ Dys. 2 w/ thin liquids; trials to upgrade to Dys. 3 as appropriate; aspiration precautions  Medication Administration: Whole meds with puree    Other  Recommendations Recommended Consults:  (dietician consult) Oral Care Recommendations: Oral care BID;Patient independent with oral care (assist)   Follow up Recommendations  Outpatient SLP (at AL)    Frequency and Duration  (TBD)   (TBD)       Prognosis Prognosis for Safe Diet Advancement: Fair Barriers to Reach Goals: Cognitive deficits (medical status)      Swallow Study   General Date of Onset: 01/06/15 HPI: Pt is a 82 y.o. male with a known history of lobar pneumonias, lobectomy of lung, COPD, and bullous emphysema, prior history of smoking, sciatica with weakness in both lower extremities presents from assisted living facility secondary to fevers, cough and confusion. Pt awakened and accepted sips of thin liquids w/ SLP but refused food trials stating he was "going home today". NSG did not have any concerns of his swallowing. Type of Study: Bedside Swallow Evaluation Previous Swallow Assessment: none indicated Diet Prior to this Study: Dysphagia 2 (chopped);Thin liquids Temperature Spikes Noted: No (wbc 9.3) Respiratory Status: Room air History of Recent Intubation: No Behavior/Cognition: Alert;Cooperative;Pleasant mood;Confused;Requires cueing Oral Cavity Assessment: Within Functional Limits Oral Care Completed by SLP: No Oral Cavity - Dentition: Adequate natural dentition Vision: Functional for self-feeding Self-Feeding Abilities: Able to feed self;Needs assist;Needs set up Patient Positioning:  Upright in bed Baseline Vocal Quality: Normal Volitional Cough: Strong Volitional Swallow: Able to elicit    Oral/Motor/Sensory Function Overall Oral Motor/Sensory Function: Within functional limits (appeared; limited cooperation)   [employer] [place] Chips Ice  chips: Not tested   Thin Liquid Thin Liquid: Within functional limits Presentation: Cup;Straw;Self Fed (assisted; ~2-3 ozs total of water and coffee) Other Comments: pt then wanted to use a straw for drinking coffee    Nectar Thick Nectar Thick Liquid: Not tested   Honey Thick Honey Thick Liquid: Not tested   Puree Puree: Not tested   Solid   GO    Solid: Not tested Other Comments: pt declined food trials       Jerilynn SomKatherine Watson, MS, CCC-SLP  Watson,Katherine 01/08/2015,12:03 PM

## 2015-01-08 NOTE — Discharge Summary (Addendum)
[facility] - [place] at [facility]   PATIENT NAME: Donald Blanchard    MR#:  [id number]  DATE OF BIRTH:  December 09, 1930  DATE OF ADMISSION:  01/06/2015 ADMITTING PHYSICIAN: Enid Baas, MD  DATE OF DISCHARGE: No discharge date for patient encounter.  PRIMARY CARE PHYSICIAN: No primary care provider on file.     ADMISSION DIAGNOSIS:  Right upper lobe pneumonia [J18.9] Sepsis, due to unspecified organism (HCC) [A41.9]  DISCHARGE DIAGNOSIS:  Principal Problem:   Sepsis (HCC) Active Problems:   Right upper lobe pneumonia   Emphysema lung (HCC)   Leukocytosis   Dementia with behavioral disturbance   Dysphagia   Anemia   Atrial fibrillation (HCC)   SECONDARY DIAGNOSIS:   Past Medical History  Diagnosis Date  . COPD (chronic obstructive pulmonary disease) (HCC)     not on home o2  . Emphysema of lung (HCC)   . Sciatica   . A-fib (HCC)     .pro HOSPITAL COURSE:   The patient is 82 year old Caucasian male with past medical history significant for history of atrial fibrillation, lung, emphysema, COPD, prior history of smoking who lives in assisted living facility with his wife, presents to the hospital with fever, cough, as well as confusion. On arrival to emergency room, patient's labs revealed elevated white blood cell count to 13,000, his chest x-ray revealed acute right upper lobe pneumonia. Patient was admitted to the hospital for antibiotic therapy. He was managed on the Rocephin and Zithromax while in the hospital intravenously and his condition improved. His oxygenation improved as well to 97% on room air from 93% on admission. Patient's mother cell count improved. Patient was felt to be stable to be discharged back home with home health physical therapy as well as speech therapist evaluation. Patient's diet was downgraded to a dysphagia 2 diet with thin liquids until speech therapist evaluation. Patient was advised to continue antibiotic therapy for  a few more days to complete course with levofloxacin.  Discussion by problem  #1 sepsis-secondary to community-acquired pneumonia. - Was on Rocephin, Zithromax while in the hospital. However, patient's pneumonia location in the right upper lobe is concerning for aspiration. We will initiate levofloxacin to continue and finish its course bloodes are (-) , sputum cultures were not obtained  - cont. Mucinex. Afebrile , not on oxygen, oxygenation has improved.   #2 COPD-cont. Spiriva.  adding DuoNeb's as needed at home. No acute exacerbation.  - cont. duonebs pRN.   #3 sciatica pain-continue tramadol as needed. - PT evalrecommended skilled nursing facility placement with apparently patient's family does not want him to go to skilled nursing facility, but rather back to assisted living facility with his wife, where patient will be discharged with home health services.   #5 A. fib-doesn't take any medications at home. Rate controlled. -Not on any anticoagulation as has dementia and a high fall risk.   #6 Dementia with behavioral disturbance - pt. Had some episodes of agitation this afternoon.  - PrN haldol, prescription written for home. Avoid Benzo's.   `#7. Generalized weakness as above. Home with home health   #8. Suspected dysphagia Patient's diet is downgraded to dysphagia 2 with thin liquids, patient is to follow-up with home health speech therapy and make decisions about modified swallowing study if needed DISCHARGE CONDITIONS:   Stable   CONSULTS OBTAINED:     DRUG ALLERGIES:  No Known Allergies  DISCHARGE MEDICATIONS:   Current Discharge Medication List    START taking these medications  Details  guaiFENesin (MUCINEX) 600 MG 12 hr tablet Take 1 tablet (600 mg total) by mouth 2 (two) times daily. Qty: 20 tablet, Refills: 0    ipratropium-albuterol (DUONEB) 0.5-2.5 (3) MG/3ML SOLN Take 3 mLs by nebulization every 6 (six) hours as needed. Qty: 360 mL, Refills: 5     levofloxacin (LEVAQUIN) 750 MG tablet Take 1 tablet (750 mg total) by mouth every other day. Qty: 4 tablet, Refills: 0      CONTINUE these medications which have NOT CHANGED   Details  docusate sodium (COLACE) 100 MG capsule Take 100 mg by mouth every 12 (twelve) hours as needed for mild constipation or moderate constipation.    Multiple Vitamins-Minerals (MULTIVITAMIN WITH MINERALS) tablet Take 1 tablet by mouth daily.    polyethylene glycol (MIRALAX / GLYCOLAX) packet Take 17 g by mouth daily as needed for mild constipation, moderate constipation or severe constipation. Mix in 8 oz water    tiotropium (SPIRIVA) 18 MCG inhalation capsule Place 18 mcg into inhaler and inhale daily.    traMADol (ULTRAM) 50 MG tablet Take 50 mg by mouth 3 (three) times daily.    vitamin B-12 (CYANOCOBALAMIN) 1000 MCG tablet Take 1,000 mcg by mouth daily.         DISCHARGE INSTRUCTIONS:    Patient is to follow-up with his primary care physician   If you experience worsening of your admission symptoms, develop shortness of breath, life threatening emergency, suicidal or homicidal thoughts you must seek medical attention immediately by calling 911 or calling your MD immediately  if symptoms less severe.  You Must read complete instructions/literature along with all the possible adverse reactions/side effects for all the Medicines you take and that have been prescribed to you. Take any new Medicines after you have completely understood and accept all the possible adverse reactions/side effects.   Please note  You were cared for by a hospitalist during your hospital stay. If you have any questions about your discharge medications or the care you received while you were in the hospital after you are discharged, you can call the unit and asked to speak with the hospitalist on call if the hospitalist that took care of you is not available. Once you are discharged, your primary care physician will handle  any further medical issues. Please note that NO REFILLS for any discharge medications will be authorized once you are discharged, as it is imperative that you return to your primary care physician (or establish a relationship with a primary care physician if you do not have one) for your aftercare needs so that they can reassess your need for medications and monitor your lab values.    Today   CHIEF COMPLAINT:   Chief Complaint  Patient presents with  . Fever    HISTORY OF PRESENT ILLNESS:  Donald Blanchard  is a 82 y.o. male with a known history of atrial fibrillation, lung, emphysema, COPD, prior history of smoking who lives in assisted living facility with his wife, presents to the hospital with fever, cough, as well as confusion. On arrival to emergency room, patient's labs revealed elevated white blood cell count to 13,000, his chest x-ray revealed acute right upper lobe pneumonia. Patient was admitted to the hospital for antibiotic therapy. He was managed on the Rocephin and Zithromax while in the hospital intravenously and his condition improved. His oxygenation improved as well to 97% on room air from 93% on admission. Patient's mother cell count improved. Patient was felt to be stable  to be discharged back home with home health physical therapy as well as speech therapist evaluation. Patient's diet was downgraded to a dysphagia 2 diet with thin liquids until speech therapist evaluation. Patient was advised to continue antibiotic therapy for a few more days to complete course with levofloxacin.  Discussion by problem  #1 sepsis-secondary to community-acquired pneumonia. - Was on Rocephin, Zithromax while in the hospital. However, patient's pneumonia location in the right upper lobe is concerning for aspiration. We will initiate levofloxacin to continue and finish its course bloodes are (-) , sputum cultures were not obtained  - cont. Mucinex. Afebrile , not on oxygen, oxygenation has  improved.   #2 COPD-cont. Spiriva.  adding DuoNeb's as needed at home. No acute exacerbation.  - cont. duonebs pRN.   #3 sciatica pain-continue tramadol as needed. - PT evalrecommended skilled nursing facility placement with apparently patient's family does not want him to go to skilled nursing facility, but rather back to assisted living facility with his wife, where patient will be discharged with home health services.   #5 A. fib-doesn't take any medications at home. Rate controlled. -Not on any anticoagulation as has dementia and a high fall risk.   #6 Dementia with behavioral disturbance - pt. Had some episodes of agitation this afternoon.  - PrN haldol, prescription written for home. Avoid Benzo's.   `#7. Generalized weakness as above. Home with home health   #8. Suspected dysphagia done great. Patient's diet to dysphagia 2 with thin liquids, patient is to follow-up with home health speech therapy and make decisions about modified swallowing study if needed  VITAL SIGNS:  Blood pressure 142/67, pulse 65, temperature 98 F (36.7 C), temperature source Axillary, resp. rate 17, height 6\' 3"  (1.905 m), weight 61.236 kg (135 lb), SpO2 98 %.  I/O:   Intake/Output Summary (Last 24 hours) at 01/08/15 1007 Last data filed at 01/08/15 0400  Gross per 24 hour  Intake    740 ml  Output      0 ml  Net    740 ml    PHYSICAL EXAMINATION:  GENERAL:  82 y.o.-year-old patient lying in the bed with no acute distress.  EYES: Pupils equal, round, reactive to light and accommodation. No scleral icterus. Extraocular muscles intact.  HEENT: Head atraumatic, normocephalic. Oropharynx and nasopharynx clear.  NECK:  Supple, no jugular venous distention. No thyroid enlargement, no tenderness.  LUNGS: Normal breath sounds bilaterally, no wheezing, rales,rhonchi or crepitation. No use of accessory muscles of respiration.  CARDIOVASCULAR: S1, S2 normal. No murmurs, rubs, or gallops.  ABDOMEN:  Soft, non-tender, non-distended. Bowel sounds present. No organomegaly or mass.  EXTREMITIES: No pedal edema, cyanosis, or clubbing.  NEUROLOGIC: Cranial nerves II through XII are intact. Muscle strength 5/5 in all extremities. Sensation intact. Gait not checked.  PSYCHIATRIC: The patient is alert and oriented x 3.  SKIN: No obvious rash, lesion, or ulcer.   DATA REVIEW:   CBC  Recent Labs Lab 01/07/15 0457  WBC 9.3  HGB 11.0*  HCT 33.0*  PLT 153    Chemistries   Recent Labs Lab 01/06/15 1740 01/07/15 0457  NA 137 135  K 3.8 3.5  CL 103 104  CO2 28 26  GLUCOSE 101* 77  BUN 23* 19  CREATININE 0.99 0.61  CALCIUM 8.6* 8.0*  AST 28  --   ALT 17  --   ALKPHOS 59  --   BILITOT 0.7  --     Cardiac Enzymes  Recent Labs  Lab 01/06/15 1740  TROPONINI <0.03    Microbiology Results  Results for orders placed or performed during the hospital encounter of 01/06/15  Culture, blood (Routine X 2) w Reflex to ID Panel     Status: None (Preliminary result)   Collection Time: 01/06/15  5:40 PM  Result Value Ref Range Status   Specimen Description BLOOD LEFT FATTY CASTS  Final   Special Requests BOTTLES DRAWN AEROBIC AND ANAEROBIC 6CC  Final   Culture NO GROWTH 2 DAYS  Final   Report Status PENDING  Incomplete  Culture, blood (Routine X 2) w Reflex to ID Panel     Status: None (Preliminary result)   Collection Time: 01/06/15  5:40 PM  Result Value Ref Range Status   Specimen Description BLOOD LEFT WRIST  Final   Special Requests   Final    BOTTLES DRAWN AEROBIC AND ANAEROBIC AER 5CC ANA 1CC   Culture NO GROWTH 2 DAYS  Final   Report Status PENDING  Incomplete    RADIOLOGY:  Dg Chest 2 View  01/06/2015  CLINICAL DATA:  82 year old nursing home patient with acute onset of fever. Current history of emphysema. EXAM: CHEST  2 VIEW COMPARISON:  None. FINDINGS: Cardiac silhouette normal in size. Thoracic aorta atherosclerotic and mildly tortuous. Hilar and mediastinal contours  otherwise unremarkable. Lungs hyperinflated with diffuse emphysematous changes and likely interstitial fibrosis in the lower lobes and lingula. Patchy airspace opacities in the right upper lobe scattered calcified granulomata in both lungs. Degenerative changes involving the thoracic spine. IMPRESSION: Acute right upper lobe pneumonia superimposed upon severe COPD/emphysema. Electronically Signed   By: Hulan Saashomas  Lawrence M.D.   On: 01/06/2015 17:48    EKG:   Orders placed or performed during the hospital encounter of 01/06/15  . ED EKG  . ED EKG      Management plans discussed with the patient, family and they are in agreement.  CODE STATUS:     Code Status Orders        Start     Ordered   01/06/15 2209  Full code   Continuous     01/06/15 2209    Advance Directive Documentation        Most Recent Value   Type of Advance Directive  Healthcare Power of Attorney, Living will   Pre-existing out of facility DNR order (yellow form or pink MOST form)     "MOST" Form in Place?        TOTAL TIME TAKING CARE OF THIS PATIENT: 45 minutes.  Discussed this patient's daughter   Katharina CaperVAICKUTE,Maude Gloor M.D on 01/08/2015 at 10:07 AM  Between 7am to 6pm - Pager - [phone]  After 6pm go to [email or web address] - password [email or web address] [email or web address]  [facility]  Office  [phone]  CC: Primary care physician; No primary care provider on file.

## 2015-01-08 NOTE — Care Management Note (Addendum)
Case Management Note  Patient Details  Name: Donald Blanchard MRN: [id number] Date of Birth: 05/05/1930  Subjective/Objective:     This Clinical research associatewriter gave a copy of the order for a wheelchair written by Dr Cherlynn KaiserSainani to Barbara CowerJason at [facility] to have on file at the [facility] office on [address] Also gave a copy of the order for a wheelchair to Sammuel Hineseborah Moore, CSW, to provide to Mr Lierman's daughter. Dr Winona LegatoVaickute ordered a home nebulizer and home health PT, RN, and speech. Spoke with Laqueta Lindenourtney Bruxton at [facility] who stated that they prefer to use [facility] for their patients. A referral was faxed and called to [facility] for RN, PT, Speech. .               Action/Plan:   Expected Discharge Date:  01/10/15               Expected Discharge Plan:     In-House Referral:     Discharge planning Services     Post Acute Care Choice:    Choice offered to:     DME Arranged:    DME Agency:     HH Arranged:    HH Agency:     Status of Service:     Medicare Important Message Given:  Yes Date Medicare IM Given:    Medicare IM give by:    Date Additional Medicare IM Given:    Additional Medicare Important Message give by:     If discussed at Long Length of Stay Meetings, dates discussed:    Additional Comments:  Ishmael Berkovich A, RN 01/08/2015, 1:04 PM

## 2015-01-11 LAB — CULTURE, BLOOD (ROUTINE X 2)
Culture: NO GROWTH
Culture: NO GROWTH

## 2017-01-04 ENCOUNTER — Emergency Department: Payer: Medicare Other

## 2017-01-04 ENCOUNTER — Encounter: Payer: Self-pay | Admitting: Emergency Medicine

## 2017-01-04 ENCOUNTER — Other Ambulatory Visit: Payer: Self-pay

## 2017-01-04 ENCOUNTER — Observation Stay
Admission: EM | Admit: 2017-01-04 | Discharge: 2017-01-06 | Disposition: A | Discharge status: Nursing Home (Includes ICF) | Payer: Medicare Other | Attending: Internal Medicine | Admitting: Internal Medicine

## 2017-01-04 DIAGNOSIS — J8489 Other specified interstitial pulmonary diseases: Secondary | ICD-10-CM | POA: Diagnosis not present

## 2017-01-04 DIAGNOSIS — Z905 Acquired absence of kidney: Secondary | ICD-10-CM | POA: Insufficient documentation

## 2017-01-04 DIAGNOSIS — Z87891 Personal history of nicotine dependence: Secondary | ICD-10-CM | POA: Insufficient documentation

## 2017-01-04 DIAGNOSIS — J479 Bronchiectasis, uncomplicated: Secondary | ICD-10-CM | POA: Diagnosis not present

## 2017-01-04 DIAGNOSIS — J470 Bronchiectasis with acute lower respiratory infection: Secondary | ICD-10-CM | POA: Diagnosis not present

## 2017-01-04 DIAGNOSIS — F329 Major depressive disorder, single episode, unspecified: Secondary | ICD-10-CM | POA: Diagnosis not present

## 2017-01-04 DIAGNOSIS — J439 Emphysema, unspecified: Secondary | ICD-10-CM | POA: Diagnosis not present

## 2017-01-04 DIAGNOSIS — I6523 Occlusion and stenosis of bilateral carotid arteries: Secondary | ICD-10-CM | POA: Diagnosis not present

## 2017-01-04 DIAGNOSIS — I639 Cerebral infarction, unspecified: Secondary | ICD-10-CM

## 2017-01-04 DIAGNOSIS — Z66 Do not resuscitate: Secondary | ICD-10-CM | POA: Diagnosis not present

## 2017-01-04 DIAGNOSIS — D71 Functional disorders of polymorphonuclear neutrophils: Secondary | ICD-10-CM | POA: Diagnosis present

## 2017-01-04 DIAGNOSIS — Z86711 Personal history of pulmonary embolism: Secondary | ICD-10-CM | POA: Insufficient documentation

## 2017-01-04 DIAGNOSIS — Z79899 Other long term (current) drug therapy: Secondary | ICD-10-CM | POA: Insufficient documentation

## 2017-01-04 DIAGNOSIS — G459 Transient cerebral ischemic attack, unspecified: Secondary | ICD-10-CM | POA: Diagnosis not present

## 2017-01-04 DIAGNOSIS — I700 Atherosclerosis of aorta: Secondary | ICD-10-CM | POA: Diagnosis not present

## 2017-01-04 DIAGNOSIS — G9341 Metabolic encephalopathy: Principal | ICD-10-CM

## 2017-01-04 DIAGNOSIS — G4700 Insomnia, unspecified: Secondary | ICD-10-CM | POA: Diagnosis not present

## 2017-01-04 DIAGNOSIS — J449 Chronic obstructive pulmonary disease, unspecified: Secondary | ICD-10-CM | POA: Insufficient documentation

## 2017-01-04 DIAGNOSIS — J8410 Pulmonary fibrosis, unspecified: Secondary | ICD-10-CM | POA: Insufficient documentation

## 2017-01-04 DIAGNOSIS — I4891 Unspecified atrial fibrillation: Secondary | ICD-10-CM | POA: Insufficient documentation

## 2017-01-04 DIAGNOSIS — K219 Gastro-esophageal reflux disease without esophagitis: Secondary | ICD-10-CM | POA: Diagnosis not present

## 2017-01-04 DIAGNOSIS — R4182 Altered mental status, unspecified: Secondary | ICD-10-CM | POA: Diagnosis present

## 2017-01-04 LAB — URINALYSIS, COMPLETE (UACMP) WITH MICROSCOPIC
BILIRUBIN URINE: NEGATIVE
Bacteria, UA: NONE SEEN
Glucose, UA: NEGATIVE mg/dL
HGB URINE DIPSTICK: NEGATIVE
KETONES UR: NEGATIVE mg/dL
LEUKOCYTES UA: NEGATIVE
NITRITE: NEGATIVE
PH: 6 (ref 5.0–8.0)
Protein, ur: 100 mg/dL — AB
SQUAMOUS EPITHELIAL / LPF: NONE SEEN
Specific Gravity, Urine: 1.024 (ref 1.005–1.030)

## 2017-01-04 LAB — CBC
HCT: 37.9 % — ABNORMAL LOW (ref 40.0–52.0)
Hemoglobin: 12.6 g/dL — ABNORMAL LOW (ref 13.0–18.0)
MCH: 33 pg (ref 26.0–34.0)
MCHC: 33.2 g/dL (ref 32.0–36.0)
MCV: 99.1 fL (ref 80.0–100.0)
Platelets: 203 10*3/uL (ref 150–440)
RBC: 3.82 MIL/uL — ABNORMAL LOW (ref 4.40–5.90)
RDW: 19.9 % — AB (ref 11.5–14.5)
WBC: 7 10*3/uL (ref 3.8–10.6)

## 2017-01-04 LAB — PROTIME-INR
INR: 1.1
Prothrombin Time: 14.1 s (ref 11.4–15.2)

## 2017-01-04 LAB — COMPREHENSIVE METABOLIC PANEL
ALT: 8 U/L — ABNORMAL LOW (ref 17–63)
ANION GAP: 8 (ref 5–15)
AST: 28 U/L (ref 15–41)
Albumin: 4.3 g/dL (ref 3.5–5.0)
Alkaline Phosphatase: 72 U/L (ref 38–126)
BUN: 22 mg/dL — ABNORMAL HIGH (ref 6–20)
CHLORIDE: 102 mmol/L (ref 101–111)
CO2: 30 mmol/L (ref 22–32)
Calcium: 9.4 mg/dL (ref 8.9–10.3)
Creatinine, Ser: 1 mg/dL (ref 0.61–1.24)
Glucose, Bld: 196 mg/dL — ABNORMAL HIGH (ref 65–99)
Potassium: 3.8 mmol/L (ref 3.5–5.1)
SODIUM: 140 mmol/L (ref 135–145)
Total Bilirubin: 1 mg/dL (ref 0.3–1.2)
Total Protein: 8 g/dL (ref 6.5–8.1)

## 2017-01-04 LAB — TROPONIN I: Troponin I: <0.03 ng/mL (ref <0.03)

## 2017-01-04 LAB — APTT: aPTT: 37 seconds — ABNORMAL HIGH (ref 24–36)

## 2017-01-04 LAB — LACTIC ACID, PLASMA
LACTIC ACID, VENOUS: 1.2 mmol/L (ref 0.5–1.9)
Lactic Acid, Venous: 1 mmol/L (ref 0.5–1.9)

## 2017-01-04 MED ORDER — TIOTROPIUM BROMIDE MONOHYDRATE 18 MCG IN CAPS
18.0000 ug | ORAL_CAPSULE | Freq: Every day | RESPIRATORY_TRACT | Status: DC
  Administered 2017-01-05 – 2017-01-06 (×2): 18 ug via RESPIRATORY_TRACT
  Filled 2017-01-04: qty 5

## 2017-01-04 MED ORDER — ONDANSETRON HCL 4 MG PO TABS: 4.0000 mg | ORAL_TABLET | Freq: Four times a day (QID) | ORAL | Status: DC | PRN

## 2017-01-04 MED ORDER — PANTOPRAZOLE SODIUM 40 MG PO TBEC
40.0000 mg | DELAYED_RELEASE_TABLET | Freq: Every day | ORAL | Status: DC
  Administered 2017-01-05 – 2017-01-06 (×2): 40 mg via ORAL
  Filled 2017-01-04 (×2): qty 1

## 2017-01-04 MED ORDER — ACETAMINOPHEN 325 MG PO TABS: 650.0000 mg | ORAL_TABLET | Freq: Four times a day (QID) | ORAL | Status: DC | PRN

## 2017-01-04 MED ORDER — MIRTAZAPINE 15 MG PO TABS
7.5000 mg | ORAL_TABLET | Freq: Every day | ORAL | Status: DC
  Administered 2017-01-04 – 2017-01-05 (×2): 7.5 mg via ORAL
  Filled 2017-01-04 (×2): qty 1

## 2017-01-04 MED ORDER — DEXTROSE 5 % IV SOLN
250.0000 mg | INTRAVENOUS | Status: DC
  Administered 2017-01-04: 250 mg via INTRAVENOUS
  Filled 2017-01-04: qty 250

## 2017-01-04 MED ORDER — LEVOFLOXACIN IN D5W 500 MG/100ML IV SOLN: 500.0000 mg | Freq: Once | INTRAVENOUS | Status: DC

## 2017-01-04 MED ORDER — IOPAMIDOL (ISOVUE-300) INJECTION 61%
75.0000 mL | Freq: Once | INTRAVENOUS | Status: AC | PRN
  Administered 2017-01-04: 75 mL via INTRAVENOUS

## 2017-01-04 MED ORDER — ONDANSETRON HCL 4 MG/2ML IJ SOLN: 4.0000 mg | Freq: Four times a day (QID) | INTRAMUSCULAR | Status: DC | PRN

## 2017-01-04 MED ORDER — ENOXAPARIN SODIUM 40 MG/0.4ML ~~LOC~~ SOLN
40.0000 mg | SUBCUTANEOUS | Status: DC
  Administered 2017-01-04 – 2017-01-05 (×2): 40 mg via SUBCUTANEOUS
  Filled 2017-01-04 (×2): qty 0.4

## 2017-01-04 MED ORDER — ACETAMINOPHEN 650 MG RE SUPP: 650.0000 mg | Freq: Four times a day (QID) | RECTAL | Status: DC | PRN

## 2017-01-04 MED ORDER — DEXTROSE 5 % IV SOLN
1.0000 g | INTRAVENOUS | Status: DC
  Administered 2017-01-04: 1 g via INTRAVENOUS
  Filled 2017-01-04: qty 10

## 2017-01-04 MED ORDER — IPRATROPIUM-ALBUTEROL 0.5-2.5 (3) MG/3ML IN SOLN: 3.0000 mL | Freq: Four times a day (QID) | RESPIRATORY_TRACT | Status: DC | PRN

## 2017-01-04 MED ORDER — LEVOFLOXACIN IN D5W 250 MG/50ML IV SOLN: 250.0000 mg | INTRAVENOUS | Status: DC

## 2017-01-04 NOTE — ED Triage Notes (Signed)
EM[facility]. EMS reports that staff told them saughter noticed last night that he was not "right". This am staff noticed that he was unable to walk with walker was picking up his cheerios differently and face planted in his breakfast.

## 2017-01-04 NOTE — ED Notes (Signed)
Called daughter back and she reports that she was with her father at 1700 last night and he was WNL. At 2100 he called his daughter and ask where she put his flashlight. She states that's when she noticed that he was not acting right but thought it might be his tramadol. She also reports that his speech has been garbled like it is for a month just progressively getting worse.

## 2017-01-04 NOTE — Clinical Social Work Note (Signed)
Clinical Social Work Assessment  Patient Details  Name: Donald LeveringJohn Age MRN: [id number] Date of Birth: 03/10/1930  Date of referral:  01/04/17               Reason for consult:  Other (Comment Required)(From [facility] )                Permission sought to share information with:  Facility Industrial/product designerContact Representative Permission granted to share information::  Yes, Verbal Permission Granted  Name::        Agency::     Relationship::     Contact Information:     Housing/Transportation Living arrangements for the past 2 months:  Assisted Living Facility Source of Information:  Adult Children, Facility Patient Interpreter Needed:  None Criminal Activity/Legal Involvement Pertinent to Current Situation/Hospitalization:  No - Comment as needed Significant Relationships:  Adult Children Lives with:  Facility Resident Do you feel safe going back to the place where you live?  Yes Need for family participation in patient care:  Yes (Comment)  Care giving concerns:  [facility] a [facility]. [facility] fax # [phone].    Social Worker assessment / plan:  Visual merchandiserClinical Social Worker (CSW) received verbal consult from Scientist, physiologicalN case manager that [facility] is [facility]. Per Misty StanleyLisa clinical coordinator at [facility] patient is on the ALF side and has been a resident since 2015. Per Misty StanleyLisa at baseline patient is alert and oriented X3, on room air, and ambulates with a rollaider without assistance. Per Misty StanleyLisa patient does not require assistance for his ADLs except for showering. Per Misty StanleyLisa patient fell once in the shower and has asked for assistance since that time. Per Misty StanleyLisa patient's daughter Berton BonCynthia Blanchard [phone] his is HPOA. CSW made Misty StanleyLisa aware that patient is under observation and rule out TB. Per Misty StanleyLisa patient can return [facility] when stable. CSW contacted patient's daughter Donald BeechamCynthia. Per daughter she has spoken with the doctor and made him aware that patient has a lot  of scare tissue on his lungs. Daughter reported that she will come visit patient after she gets off work today. Daughter is agreeable for patient to D/C back [facility] and requested that [facility] transport. CSW will continue to follow and assist as needed.   Employment status:  Retired Database administratornsurance information:  Managed Medicare PT Recommendations:  Not assessed at this time Information / Referral to community resources:  Other (Comment Required)([facility]. )  Patient/Family's Response to care:  Patient's daughter is agreeable for patient to return [facility].   Patient/Family's Understanding of and Emotional Response to Diagnosis, Current Treatment, and Prognosis:  Patient's daughter was very pleasant and thanked CSW for assistance.   Emotional Assessment Appearance:  Appears stated age Attitude/Demeanor/Rapport:  Unable to Assess Affect (typically observed):  Unable to Assess Orientation:  Oriented to Self, Oriented to Place, Fluctuating Orientation (Suspected and/or reported Sundowners) Alcohol / Substance use:  Not Applicable Psych involvement (Current and /or in the community):  No (Comment)  Discharge Needs  Concerns to be addressed:  Discharge Planning Concerns Readmission within the last 30 days:  No Current discharge risk:  Chronically ill Barriers to Discharge:  Continued Medical Work up   [employer], Darleen CrockerBailey M, LCSW 01/04/2017, 4:42 PM

## 2017-01-04 NOTE — Progress Notes (Signed)
Pt. Had CT chest findings suggestive of chronic granulomatous disease/possible tuberculosis. My clinical suspicion for tuberculosis is low as patient has no acute symptoms presently. -Patient has had a previous workup for TB which has been negative. Discussed with infectious disease Dr. Sampson GoonFitzgerald and as he has had no further workup in the past few years will place him on airborne precautions and attempt to get 3 sputums for AFB.  Nursing staff has been notified.

## 2017-01-04 NOTE — [place] FL2 (Signed)
[place] MEDICAID FL2 LEVEL OF CARE SCREENING TOOL     IDENTIFICATION  Patient Name: Donald Blanchard Birthdate: [birth year] Sex: male Admission Date (Current Location): 01/04/2017  [facility] and [place] Number:  Chiropodist and Address:  [facility], [address] [place], [place] [address]      Provider Number: [id number]  Attending Physician Name and Address:  Houston Siren, MD  Relative Name and Phone Number:       Current Level of Care: Hospital Recommended Level of Care: Assisted Living Facility Prior Approval Number:    Date Approved/Denied:   PASRR Number: [id number])  Discharge Plan: Domiciliary (Rest home)    Current Diagnoses: Patient Active Problem List   Diagnosis Date Noted  . Altered mental status 01/04/2017  . Right upper lobe pneumonia (HCC) 01/08/2015  . Emphysema lung (HCC) 01/08/2015  . Leukocytosis 01/08/2015  . Anemia 01/08/2015  . Atrial fibrillation (HCC) 01/08/2015  . Dementia with behavioral disturbance 01/08/2015  . Dysphagia 01/08/2015  . Sepsis (HCC) 01/06/2015    Orientation RESPIRATION BLADDER Height & Weight     Self, Place  Normal Continent Weight: 135 lb (61.2 kg) Height:  6\' 7"  (200.7 cm)  BEHAVIORAL SYMPTOMS/MOOD NEUROLOGICAL BOWEL NUTRITION STATUS      Continent Diet(Regular Diet. )  AMBULATORY STATUS COMMUNICATION OF NEEDS Skin   Supervision Verbally Normal                       Personal Care Assistance Level of Assistance  Bathing, Feeding, Dressing Bathing Assistance: Limited assistance Feeding assistance: Independent Dressing Assistance: Limited assistance     Functional Limitations Info  Sight, Hearing, Speech Sight Info: Adequate Hearing Info: Adequate Speech Info: Adequate    SPECIAL CARE FACTORS FREQUENCY  PT (By licensed PT)     PT Frequency: (2-3 home health. )              Contractures      Additional Factors Info  Code Status, Allergies, Isolation  Precautions Code Status Info: (DNR ) Allergies Info: (No Known Allergies. )     Isolation Precautions Info: (Airborne precautions to rule out TB. )     Current Medications (01/04/2017):  This is the current hospital active medication list Current Facility-Administered Medications  Medication Dose Route Frequency Provider Last Rate Last Dose  . acetaminophen (TYLENOL) tablet 650 mg  650 mg Oral Q6H PRN Houston Siren, MD       Or  . acetaminophen (TYLENOL) suppository 650 mg  650 mg Rectal Q6H PRN Houston Siren, MD      . azithromycin (ZITHROMAX) 250 mg in dextrose 5 % 125 mL IVPB  250 mg Intravenous Q24H Sainani, Rolly Pancake, MD      . cefTRIAXone (ROCEPHIN) 1 g in dextrose 5 % 50 mL IVPB  1 g Intravenous Q24H Sainani, Vivek J, MD      . enoxaparin (LOVENOX) injection 40 mg  40 mg Subcutaneous Q24H Sainani, Vivek J, MD      . ipratropium-albuterol (DUONEB) 0.5-2.5 (3) MG/3ML nebulizer solution 3 mL  3 mL Nebulization Q6H PRN Sainani, Rolly Pancake, MD      . mirtazapine (REMERON) tablet 7.5 mg  7.5 mg Oral QHS Sainani, Rolly Pancake, MD      . ondansetron (ZOFRAN) tablet 4 mg  4 mg Oral Q6H PRN Houston Siren, MD       Or  . ondansetron (ZOFRAN) injection 4 mg  4 mg Intravenous  Q6H PRN Houston SirenSainani, Vivek J, MD      . pantoprazole (PROTONIX) EC tablet 40 mg  40 mg Oral Daily Houston SirenSainani, Vivek J, MD      . Melene Muller[START ON 01/05/2017] tiotropium (SPIRIVA) inhalation capsule 18 mcg  18 mcg Inhalation Daily Houston SirenSainani, Vivek J, MD         Discharge Medications: Please see discharge summary for a list of discharge medications.  Relevant Imaging Results:  Relevant Lab Results:   Additional Information (SSN: [id number])  Donzel Romack, Darleen CrockerBailey M, LCSW

## 2017-01-04 NOTE — Consult Note (Signed)
[facility] Infectious Disease     Reason for Consult:  Abnormal CT chest     Referring Physician: Jeronimo Greaves Date of Admission:  01/04/2017   Active Problems:   Altered mental status   HPI: Donald Blanchard is a 84 y.o. male admitted with AMS and confusion and being worked up for TIA. I am asked to see for abnormal CT chest suggestive of granulomatous disease.   He is a very poor historian due to [place].  On review of notes from [facility] in 2015 he had admission for PNA and had BAL and grew Pseudomonas.  He denies any current cough, fevers or NS but is not reliable.   2015 CT of chest, abdomen, pelvis:  1. Increasing right upper lobe infiltrate. Increased size of the cavitary focus in the right upper lobe, could represent pneumatocele or small abscess. Emphysema and chronic lung changes.  2. Stable spiculated density in the superior right lower lobe, could be infectious, malignancy not excluded.  3. Slightly increased size of a right paratracheal lymph node, could be reactive.  4. Nonobstructing right renal stone. Post left nephrectomy.  5. Constipation, no obstruction.  6. Infrarenal abdominal aortic aneurysm   Sputum was a contaminated sample. Blood cultures negative.  BAL was done on 09-14-13. AFB is NEGATIVE. Culture grew Pseudomonas, sensitive to Levaquin and Cipro.    Past Medical History:  Diagnosis Date  . A-fib ([place])   . COPD (chronic obstructive pulmonary disease) (HCC)    not on home o2  . Emphysema of lung ([place])   . Sciatica    Past Surgical History:  Procedure Laterality Date  . NEPHRECTOMY     Left- secondary to trauma  . right lung lesion removal     Social History   Tobacco Use  . Smoking status: Former Smoker    Types: Cigarettes    Last attempt to quit: 01/02/2004    Years since quitting: 13.0  . Tobacco comment: Quit 10 years ago  Substance Use Topics  . Alcohol use: No    Alcohol/week: 0.0 oz  . Drug use: No   Family History  Problem Relation Age of Onset   . Hypertension Father     Allergies: No Known Allergies  Current antibiotics: Antibiotics Given (last 72 hours)    None      MEDICATIONS: . enoxaparin (LOVENOX) injection  40 mg Subcutaneous Q24H  . mirtazapine  7.5 mg Oral QHS  . pantoprazole  40 mg Oral Daily  . [START ON 01/05/2017] tiotropium  18 mcg Inhalation Daily    Review of Systems - 11 systems reviewed and negative per HPI   OBJECTIVE: Temp:  [95.5 F (35.3 C)-99.9 F (37.7 C)] 95.5 F (35.3 C) (01/04 1628) Pulse Rate:  [55-70] 55 (01/04 1628) Resp:  [14-19] 19 (01/04 1330) BP: (119-144)/(55-70) 144/62 (01/04 1628) SpO2:  [98 %-100 %] 100 % (01/04 1628) Weight:  [61.2 kg (135 lb)] 61.2 kg (135 lb) (01/04 1011) Physical Exam  Constitutional: frail, disoriented  HENT: anicteric  Mouth/Throat: Oropharynx is clear and dry . No oropharyngeal exudate.  Cardiovascular: Normal rate, regular rhythm and normal heart sounds.  ]Pulmonary/Chest: bil rhonchi, poor air movement  Abdominal: Soft. Bowel sounds are normal. He exhibits no distension. There is no tenderness.  Lymphadenopathy: He has no cervical adenopathy.  Neurological: confused   Skin: Skin is warm and dry. No rash noted. No erythema.  Psychiatric: confused  LABS: Results for orders placed or performed during the hospital encounter of 01/04/17 (  from the past 48 hour(s))  CBC     Status: Abnormal   Collection Time: 01/04/17 10:18 AM  Result Value Ref Range   WBC 7.0 3.8 - 10.6 K/uL   RBC 3.82 (L) 4.40 - 5.90 MIL/uL   Hemoglobin 12.6 (L) 13.0 - 18.0 g/dL   HCT 37.9 (L) 40.0 - 52.0 %   MCV 99.1 80.0 - 100.0 fL   MCH 33.0 26.0 - 34.0 pg   MCHC 33.2 32.0 - 36.0 g/dL   RDW 19.9 (H) 11.5 - 14.5 %   Platelets 203 150 - 440 K/uL    Comment: Performed at [facility], [address] [place], [place] [address]  Comprehensive metabolic panel     Status: Abnormal   Collection Time: 01/04/17 10:18 AM  Result Value Ref Range   Sodium 140 135 - 145  mmol/L   Potassium 3.8 3.5 - 5.1 mmol/L   Chloride 102 101 - 111 mmol/L   CO2 30 22 - 32 mmol/L   Glucose, Bld 196 (H) 65 - 99 mg/dL   BUN 22 (H) 6 - 20 mg/dL   Creatinine, Ser 1.00 0.61 - 1.24 mg/dL   Calcium 9.4 8.9 - 10.3 mg/dL   Total Protein 8.0 6.5 - 8.1 g/dL   Albumin 4.3 3.5 - 5.0 g/dL   AST 28 15 - 41 U/L   ALT 8 (L) 17 - 63 U/L   Alkaline Phosphatase 72 38 - 126 U/L   Total Bilirubin 1.0 0.3 - 1.2 mg/dL   GFR calc non Af Amer >60 >60 mL/min   GFR calc Af Amer >60 >60 mL/min    Comment: (NOTE) The eGFR has been calculated using the CKD EPI equation. This calculation has not been validated in all clinical situations. eGFR's persistently <60 mL/min signify possible Chronic Kidney Disease.    Anion gap 8 5 - 15    Comment: Performed at [facility], [address] [place], [place] [address]  Troponin I     Status: None   Collection Time: 01/04/17 10:18 AM  Result Value Ref Range   Troponin I <0.03 <0.03 ng/mL    Comment: Performed at [facility], [address] [place], [place] [address]  Urinalysis, Complete w Microscopic     Status: Abnormal   Collection Time: 01/04/17 10:59 AM  Result Value Ref Range   Color, Urine YELLOW (A) YELLOW   APPearance CLEAR (A) CLEAR   Specific Gravity, Urine 1.024 1.005 - 1.030   pH 6.0 5.0 - 8.0   Glucose, UA NEGATIVE NEGATIVE mg/dL   Hgb urine dipstick NEGATIVE NEGATIVE   Bilirubin Urine NEGATIVE NEGATIVE   Ketones, ur NEGATIVE NEGATIVE mg/dL   Protein, ur 100 (A) NEGATIVE mg/dL   Nitrite NEGATIVE NEGATIVE   Leukocytes, UA NEGATIVE NEGATIVE   RBC / HPF 0-5 0 - 5 RBC/hpf   WBC, UA 0-5 0 - 5 WBC/hpf   Bacteria, UA NONE SEEN NONE SEEN   Squamous Epithelial / LPF NONE SEEN NONE SEEN   Mucus PRESENT     Comment: Performed at [facility], [address] [place], [place] [address]  Protime-INR     Status: None   Collection Time: 01/04/17 10:59 AM  Result Value Ref Range   Prothrombin Time 14.1 11.4  - 15.2 seconds   INR 1.10     Comment: Performed at [facility], [address], [place], [place] [address]  APTT     Status: Abnormal   Collection  Time: 01/04/17 10:59 AM  Result Value Ref Range   aPTT 37 (H) 24 - 36 seconds    Comment:        IF BASELINE aPTT IS ELEVATED, SUGGEST PATIENT RISK ASSESSMENT BE USED TO DETERMINE APPROPRIATE ANTICOAGULANT THERAPY. Performed at [facility], [address] [place], [place] [address]   Lactic acid, plasma     Status: None   Collection Time: 01/04/17  1:27 PM  Result Value Ref Range   Lactic Acid, Venous 1.0 0.5 - 1.9 mmol/L    Comment: Performed at [facility], [address] [place], [place] [address]   No components found for: ESR, C REACTIVE PROTEIN MICRO: No results found for this or any previous visit (from the past 720 hour(s)).  IMAGING: Ct Head Wo Contrast  Result Date: 01/04/2017 CLINICAL DATA:  Altered level of consciousness. EXAM: CT HEAD WITHOUT CONTRAST TECHNIQUE: Contiguous axial images were obtained from the base of the skull through the vertex without intravenous contrast. COMPARISON:  None. FINDINGS: Brain: Advanced atrophy and chronic small vessel disease changes. No acute intracranial abnormality. Specifically, no hemorrhage, hydrocephalus, mass lesion, acute infarction, or significant intracranial injury. Vascular: No hyperdense vessel or unexpected calcification. Skull: No acute calvarial abnormality. Sinuses/Orbits: Visualized paranasal sinuses and mastoids clear. Orbital soft tissues unremarkable. Other: None IMPRESSION: Advanced atrophy, chronic small vessel disease. No acute intracranial abnormality. Electronically Signed   By: Rolm Baptise M.D.   On: 01/04/2017 10:45   Ct Chest W Contrast  Result Date: 01/04/2017 CLINICAL DATA:  Altered mental status.  Abnormal chest x-ray. EXAM: CT CHEST WITH CONTRAST TECHNIQUE: Multidetector CT imaging of the chest was performed during intravenous  contrast administration. CONTRAST:  91m ISOVUE-300 IOPAMIDOL (ISOVUE-300) INJECTION 61% COMPARISON:  Chest x-ray from earlier same day. Also chest x-ray dated 01/06/2015. FINDINGS: Cardiovascular: Heart size is normal. Diffuse coronary artery calcifications. No pericardial effusion. Aortic atherosclerosis. No evidence of aortic dissection. Mediastinum/Nodes: No mass or enlarged lymph nodes seen within the mediastinum or perihilar regions. Esophagus is somewhat patulous but otherwise unremarkable. Trachea is unremarkable. Lungs/Pleura: There are patchy geographic consolidations within the upper lobes bilaterally with severe bronchiectasis and distortion, right greater than left with scattered nonspecific calcifications. This has progressed compared to earlier chest x-ray. Advanced pulmonary fibrosis noted at the lung bases. Spiculated nodular density within the superior segment of the right lower lobe, measuring 1.5 cm, most likely additional scarring/fibrosis contiguous with the right upper lobe dominant fibrosis but superimposed neoplastic lesion cannot be completely excluded (series 3, image 57). Upper Abdomen: No acute findings. Musculoskeletal: Degenerative changes throughout the scoliotic thoracolumbar spine. No acute or suspicious osseous finding. IMPRESSION: 1. Patchy geographic consolidations within the upper lobes bilaterally with severe bronchiectasis and distortion, right greater than left, with scattered nonspecific calcifications. This has progressed compared to earlier chest x-ray of 01/06/2015. Findings are most compatible with chronic granulomatous disease. Tuberculosis is a possibility, perhaps more likely MAI. Neoplastic process is considered less likely. Consider further characterization with bronchoscopy, sputum and/or QuantiFERON Gold if clinically indicated. 2. Spiculated nodular density within the superior segment of the right lower lobe, measuring 1.5 cm, most likely additional chronic  scarring/fibrosis as it is nearly contiguous with the right upper lobe changes. Superimposed neoplastic lesion cannot be confidently excluded. Recommend follow-up chest CT in 3 months for this lesion to ensure stability. 3. Aortic atherosclerosis. Electronically Signed   By: SFranki CabotM.D.   On: 01/04/2017 12:41   Dg Chest Port 1 View  Addendum Date:  01/04/2017   ADDENDUM REPORT: 01/04/2017 11:07 ADDENDUM: These results were called by telephone at the time of interpretation on 01/04/2017 at 10:52 am to Dr. Lavonia Drafts , who verbally acknowledged these results. Electronically Signed   By: Fidela Salisbury M.D.   On: 01/04/2017 11:07   Result Date: 01/04/2017 CLINICAL DATA:  Altered mental status. History emphysema, COPD and AFib. EXAM: PORTABLE CHEST 1 VIEW COMPARISON:  01/06/2015 FINDINGS: Cardiomediastinal silhouette is normal. Mediastinal contours appear intact. Tortuosity and calcific atherosclerotic disease of the aorta. There is no evidence of pneumothorax. Advanced upper lobe predominant emphysematous changes and mild chronic interstitial lung disease. There are linear and peribronchial interstitial and airspace opacities in the right upper lobe extending to the pleural surfaces. Possible few mm pulmonary nodules are seen in the right mid lung field. Osseous structures are without acute abnormality. Soft tissues are grossly normal. IMPRESSION: Linear peribronchial airspace opacities in the right upper lobe extending to the pleural surfaces, with associated pleural thickening, on the background of advanced emphysematous changes and mild interstitial lung disease. These findings may represent infectious airspace consolidation, bronchogenic carcinoma, or granulomatous disease with pulmonary tuberculosis being a consideration. Please correlate clinically. Electronically Signed: By: Fidela Salisbury M.D. On: 01/04/2017 10:48    Assessment:   Donald Blanchard is a 84 y.o. male admitted with AMS and  being evaluated for TIA.  He has a very abnormal CT scan of chest with RUL airspace disease and advanced interstitial disease.  He has no report of fevers, ns or cough. IN 2015 he had a similar CT scan per report and grew Pseduomonas. He had a negative BAL AFB and an indeterminate QFG.   I am not able to review the CT in person from 2015 but it would be helpful if this could be obtained and reviewed by radiology. I suspect this is all chronic changes.  I think after further review there is no need to repeat a work up unless something else changes in the history or pulmonary feels differently.   Recommendations DC AFB precautions.  Can treat with Levofloxacin given prior pseudomonas if concerned about CAP.  Thank you very much for allowing me to participate in the care of this patient. Please call with questions.   Cheral Marker. Ola Spurr, MD

## 2017-01-04 NOTE — Consult Note (Signed)
[facility] Consultation      Assessment and Plan:  Chronic respiratory failure with emphysema, pulmonary fibrosis changes suggestive of NSIP, and bronchiectasis.   --The patient's respiratory findings appear to be chronic and slowly progressive. He has previously undergone a workup including bronchoscopy in 2015 which was negative for MAC. Despite his chronic respiratory findings he has managed to do well without any respiratory admissions in the past 3 years which is encouraging. Given his advanced age and relative stability, would not work up further unless the patient develops acute respiratory symptoms.  --Recommend continued empiric treatment for now with bronchodilators and abx as needed. Given possible history of MAC avoid inhaled steroids.     Date: 01/04/2017  MRN# [id number] Donald Blanchard 02/16/1930  Referring Physician: Dr. Verdell Carmine.   Donald Blanchard is a 84 y.o. old male seen in consultation for chief complaint of:    Chief Complaint  Patient presents with  . Transient Ischemic Attack    HPI:   The patient is an 84 year old male, his history includes bullous emphysema, unilateral nephrectomy, left lower lobe lobectomy in January 2015, COPD, bladder cancer, PE, afib.  The patient currently lives in assisted living with dementia. I spoke with the patients daughter via telephone who notes that he was very confused yesterday evening. She also notes that he occasionally has trouble swallowing when eating. He was brought to the ED, he had an abnormal cxr which lead to a CT. On review of his history, he has been noted to have cavitary pneumonia in the past, as per outside hospital notes in 2015. He was treated at [facility] for abnormal findings of the lungs. On that occasion he was ruled out for TB. During that admission, he underwent bronchoscopy AFB was negative, Aspergillus and cytology for malignancy were negative, culture was positive for Enterobacter asburiae which  is also sensitive to Levaquin I have personally reviewed images from this admission, in comparison with lung cuts on CTAP on 12/17/13. The findings on CT chest show severe emphysema with hyperinflation, changes of pulmonary fibrosis which appear suggestive of NSIP, as well as bronchiectasis changes. These changes appear to have advanced since previous study in 2015.    PMHX:   Past Medical History:  Diagnosis Date  . A-fib ([place])   . COPD (chronic obstructive pulmonary disease) (HCC)    not on home o2  . Emphysema of lung ([place])   . Sciatica    Surgical Hx:  Past Surgical History:  Procedure Laterality Date  . NEPHRECTOMY     Left- secondary to trauma  . right lung lesion removal     Family Hx:  Family History  Problem Relation Age of Onset  . Hypertension Father    Social Hx:   Social History   Tobacco Use  . Smoking status: Former Smoker    Types: Cigarettes    Last attempt to quit: 01/02/2004    Years since quitting: 13.0  . Tobacco comment: Quit 10 years ago  Substance Use Topics  . Alcohol use: No    Alcohol/week: 0.0 oz  . Drug use: No   Medication:   No current facility-administered medications for this encounter.   Current Outpatient Medications:  .  acetaminophen (TYLENOL) 325 MG tablet, Take 650 mg by mouth every 6 (six) hours as needed., Disp: , Rfl:  .  mirtazapine (REMERON) 7.5 MG tablet, Take 7.5 mg by mouth at bedtime., Disp: , Rfl:  .  omeprazole (PRILOSEC) 20 MG capsule, Take 20  mg by mouth daily., Disp: , Rfl:  .  tiotropium (SPIRIVA) 18 MCG inhalation capsule, Place 18 mcg into inhaler and inhale daily., Disp: , Rfl:  .  traMADol (ULTRAM) 50 MG tablet, Take 50 mg by mouth 3 (three) times daily., Disp: , Rfl:  .  guaiFENesin (MUCINEX) 600 MG 12 hr tablet, Take 1 tablet (600 mg total) by mouth 2 (two) times daily. (Patient not taking: Reported on 01/04/2017), Disp: 20 tablet, Rfl: 0 .  haloperidol (HALDOL) 1 MG tablet, Take 1 tablet (1 mg total) by mouth  every 8 (eight) hours as needed for agitation. (Patient not taking: Reported on 01/04/2017), Disp: 30 tablet, Rfl: 5 .  ipratropium-albuterol (DUONEB) 0.5-2.5 (3) MG/3ML SOLN, Take 3 mLs by nebulization every 6 (six) hours as needed. (Patient not taking: Reported on 01/04/2017), Disp: 360 mL, Rfl: 5 .  levofloxacin (LEVAQUIN) 750 MG tablet, Take 1 tablet (750 mg total) by mouth every other day. (Patient not taking: Reported on 01/04/2017), Disp: 4 tablet, Rfl: 0   Allergies:  Patient has no known allergies.  Review of Systems: Gen:  Denies  fever, sweats, chills HEENT: Denies blurred vision, double vision. bleeds, sore throat Cvc:  No dizziness, chest pain. Resp:   Denies cough or sputum production, shortness of breath Gi: Denies swallowing difficulty, stomach pain. Gu:  Denies bladder incontinence, burning urine Ext:   No Joint pain, stiffness. Skin: No skin rash,  hives  Endoc:  No polyuria, polydipsia. Psych: No depression, insomnia. Other:  All other systems were reviewed with the patient and were negative other that what is mentioned in the HPI.   Physical Examination:   VS: BP 119/70   Pulse (!) 58   Temp 99.9 F (37.7 C)   Resp 19   Ht _0  (2.007 m)   Wt 135 lb (61.2 kg)   SpO2 98%   BMI 15.21 kg/m   General Appearance: No distress  Neuro:without focal findings,  speech normal,  HEENT: PERRLA, EOM intact.   Pulmonary: normal breath sounds, No wheezing. Scattered rhonchi.  CardiovascularNormal S1,S2.  No m/r/g.   Abdomen: Benign, Soft, non-tender. Renal:  No costovertebral tenderness  GU:  No performed at this time. Endoc: No evident thyromegaly, no signs of acromegaly. Skin:   warm, no rashes, no ecchymosis  Extremities: normal, no cyanosis, clubbing.  Other findings:    LABORATORY PANEL:   CBC Recent Labs  Lab 01/04/17 1018  WBC 7.0  HGB 12.6*  HCT 37.9*  PLT 203    ------------------------------------------------------------------------------------------------------------------  Chemistries  Recent Labs  Lab 01/04/17 1018  NA 140  K 3.8  CL 102  CO2 30  GLUCOSE 196*  BUN 22*  CREATININE 1.00  CALCIUM 9.4  AST 28  ALT 8*  ALKPHOS 72  BILITOT 1.0   ------------------------------------------------------------------------------------------------------------------  Cardiac Enzymes Recent Labs  Lab 01/04/17 1018  TROPONINI <0.03   ------------------------------------------------------------  RADIOLOGY:  Ct Head Wo Contrast  Result Date: 01/04/2017 CLINICAL DATA:  Altered level of consciousness. EXAM: CT HEAD WITHOUT CONTRAST TECHNIQUE: Contiguous axial images were obtained from the base of the skull through the vertex without intravenous contrast. COMPARISON:  None. FINDINGS: Brain: Advanced atrophy and chronic small vessel disease changes. No acute intracranial abnormality. Specifically, no hemorrhage, hydrocephalus, mass lesion, acute infarction, or significant intracranial injury. Vascular: No hyperdense vessel or unexpected calcification. Skull: No acute calvarial abnormality. Sinuses/Orbits: Visualized paranasal sinuses and mastoids clear. Orbital soft tissues unremarkable. Other: None IMPRESSION: Advanced atrophy, chronic small vessel disease. No acute  intracranial abnormality. Electronically Signed   By: Rolm Baptise M.D.   On: 01/04/2017 10:45   Ct Chest W Contrast  Result Date: 01/04/2017 CLINICAL DATA:  Altered mental status.  Abnormal chest x-ray. EXAM: CT CHEST WITH CONTRAST TECHNIQUE: Multidetector CT imaging of the chest was performed during intravenous contrast administration. CONTRAST:  56m ISOVUE-300 IOPAMIDOL (ISOVUE-300) INJECTION 61% COMPARISON:  Chest x-ray from earlier same day. Also chest x-ray dated 01/06/2015. FINDINGS: Cardiovascular: Heart size is normal. Diffuse coronary artery calcifications. No pericardial  effusion. Aortic atherosclerosis. No evidence of aortic dissection. Mediastinum/Nodes: No mass or enlarged lymph nodes seen within the mediastinum or perihilar regions. Esophagus is somewhat patulous but otherwise unremarkable. Trachea is unremarkable. Lungs/Pleura: There are patchy geographic consolidations within the upper lobes bilaterally with severe bronchiectasis and distortion, right greater than left with scattered nonspecific calcifications. This has progressed compared to earlier chest x-ray. Advanced pulmonary fibrosis noted at the lung bases. Spiculated nodular density within the superior segment of the right lower lobe, measuring 1.5 cm, most likely additional scarring/fibrosis contiguous with the right upper lobe dominant fibrosis but superimposed neoplastic lesion cannot be completely excluded (series 3, image 57). Upper Abdomen: No acute findings. Musculoskeletal: Degenerative changes throughout the scoliotic thoracolumbar spine. No acute or suspicious osseous finding. IMPRESSION: 1. Patchy geographic consolidations within the upper lobes bilaterally with severe bronchiectasis and distortion, right greater than left, with scattered nonspecific calcifications. This has progressed compared to earlier chest x-ray of 01/06/2015. Findings are most compatible with chronic granulomatous disease. Tuberculosis is a possibility, perhaps more likely MAI. Neoplastic process is considered less likely. Consider further characterization with bronchoscopy, sputum and/or QuantiFERON Gold if clinically indicated. 2. Spiculated nodular density within the superior segment of the right lower lobe, measuring 1.5 cm, most likely additional chronic scarring/fibrosis as it is nearly contiguous with the right upper lobe changes. Superimposed neoplastic lesion cannot be confidently excluded. Recommend follow-up chest CT in 3 months for this lesion to ensure stability. 3. Aortic atherosclerosis. Electronically Signed   By: SFranki CabotM.D.   On: 01/04/2017 12:41   Dg Chest Port 1 View  Addendum Date: 01/04/2017   ADDENDUM REPORT: 01/04/2017 11:07 ADDENDUM: These results were called by telephone at the time of interpretation on 01/04/2017 at 10:52 am to Dr. RLavonia Drafts, who verbally acknowledged these results. Electronically Signed   By: DFidela SalisburyM.D.   On: 01/04/2017 11:07   Result Date: 01/04/2017 CLINICAL DATA:  Altered mental status. History emphysema, COPD and AFib. EXAM: PORTABLE CHEST 1 VIEW COMPARISON:  01/06/2015 FINDINGS: Cardiomediastinal silhouette is normal. Mediastinal contours appear intact. Tortuosity and calcific atherosclerotic disease of the aorta. There is no evidence of pneumothorax. Advanced upper lobe predominant emphysematous changes and mild chronic interstitial lung disease. There are linear and peribronchial interstitial and airspace opacities in the right upper lobe extending to the pleural surfaces. Possible few mm pulmonary nodules are seen in the right mid lung field. Osseous structures are without acute abnormality. Soft tissues are grossly normal. IMPRESSION: Linear peribronchial airspace opacities in the right upper lobe extending to the pleural surfaces, with associated pleural thickening, on the background of advanced emphysematous changes and mild interstitial lung disease. These findings may represent infectious airspace consolidation, bronchogenic carcinoma, or granulomatous disease with pulmonary tuberculosis being a consideration. Please correlate clinically. Electronically Signed: By: DFidela SalisburyM.D. On: 01/04/2017 10:48       Thank  you for the consultation and for allowing [facility] to assist in  the care of your patient. Our recommendations are noted above.  Please contact us if we can be of further service.   Marda Stalker, MD.  Board Certified in Internal Medicine, Pulmonary Medicine, [place], and Sleep Medicine.   [facility] Office Number: [phone]  Patricia Pesa, M.D.  Merton Border, M.D  01/04/2017

## 2017-01-04 NOTE — ED Provider Notes (Signed)
[facility] Department Provider Note   ____________________________________________    I have reviewed the triage vital signs and the nursing notes.   HISTORY  Chief Complaint Altered mental status  She is unable to provide significant history  HPI Donald Blanchard is a 84 y.o. male who presents with altered mental status.  Reportedly the patient is typically able to carry on a conversation easily and is able to ambulate by himself without difficulty.  Today staff noted that he was slumped over and acting very unusually.  Apparently he was eating Cheerios off of the table which is very unlike him.  No further history is available   Past Medical History:  Diagnosis Date  . A-fib (HCC)   . COPD (chronic obstructive pulmonary disease) (HCC)    not on home o2  . Emphysema of lung (HCC)   . Sciatica     Patient Active Problem List   Diagnosis Date Noted  . Altered mental status 01/04/2017  . Right upper lobe pneumonia (HCC) 01/08/2015  . Emphysema lung (HCC) 01/08/2015  . Leukocytosis 01/08/2015  . Anemia 01/08/2015  . Atrial fibrillation (HCC) 01/08/2015  . Dementia with behavioral disturbance 01/08/2015  . Dysphagia 01/08/2015  . Sepsis (HCC) 01/06/2015    Past Surgical History:  Procedure Laterality Date  . NEPHRECTOMY     Left- secondary to trauma  . right lung lesion removal      Prior to Admission medications   Medication Sig Start Date End Date Taking? Authorizing Provider  acetaminophen (TYLENOL) 325 MG tablet Take 650 mg by mouth every 6 (six) hours as needed.   Yes [provider]  mirtazapine (REMERON) 7.5 MG tablet Take 7.5 mg by mouth at bedtime.   Yes [provider]  omeprazole (PRILOSEC) 20 MG capsule Take 20 mg by mouth daily.   Yes [provider]  tiotropium (SPIRIVA) 18 MCG inhalation capsule Place 18 mcg into inhaler and inhale daily.   Yes [provider]  traMADol (ULTRAM)  50 MG tablet Take 50 mg by mouth 3 (three) times daily.   Yes [provider]  guaiFENesin (MUCINEX) 600 MG 12 hr tablet Take 1 tablet (600 mg total) by mouth 2 (two) times daily. Patient not taking: Reported on 01/04/2017 01/08/15   Katharina Caper, MD  haloperidol (HALDOL) 1 MG tablet Take 1 tablet (1 mg total) by mouth every 8 (eight) hours as needed for agitation. Patient not taking: Reported on 01/04/2017 01/08/15   Katharina Caper, MD  ipratropium-albuterol (DUONEB) 0.5-2.5 (3) MG/3ML SOLN Take 3 mLs by nebulization every 6 (six) hours as needed. Patient not taking: Reported on 01/04/2017 01/08/15   Katharina Caper, MD  levofloxacin (LEVAQUIN) 750 MG tablet Take 1 tablet (750 mg total) by mouth every other day. Patient not taking: Reported on 01/04/2017 01/08/15   Katharina Caper, MD     Allergies Patient has no known allergies.  Family History  Problem Relation Age of Onset  . Hypertension Father     Social History Social History   Tobacco Use  . Smoking status: Former Smoker    Types: Cigarettes    Last attempt to quit: 01/02/2004    Years since quitting: 13.0  . Tobacco comment: Quit 10 years ago  Substance Use Topics  . Alcohol use: No    Alcohol/week: 0.0 oz  . Drug use: No     Level 5 caveat: Unable to obtain review of Systems due to altered mental status  ____________________________________________   PHYSICAL EXAM:  VITAL SIGNS: ED Triage Vitals  Enc Vitals Group     BP 01/04/17 1010 124/64     Pulse Rate 01/04/17 1010 69     Resp 01/04/17 1230 14     Temp 01/04/17 1010 99.9 F (37.7 C)     Temp Source 01/04/17 1010 Oral     SpO2 01/04/17 1010 99 %     Weight 01/04/17 1011 61.2 kg (135 lb)     Height 01/04/17 1011 2.007 m (6\' 7" )     Head Circumference --      Peak Flow --      Pain Score --      Pain Loc --      Pain Edu? --      Excl. in GC? --     Constitutional: Alert but confused no acute distress.  Eyes: Conjunctivae are normal.   PERRLA  Nose: No congestion/rhinnorhea. Mouth/Throat: Mucous membranes are moist.    Cardiovascular: Normal rate, regular rhythm.  Good peripheral circulation. Respiratory: Normal respiratory effort.  No retractions. Lungs CTAB. Gastrointestinal: Soft and nontender. No distention.  Genitourinary: deferred Musculoskeletal: Warm and well perfused Neurologic:   No gross focal neurologic deficits are appreciated appears to move all extremities equally Skin:  Skin is warm, dry and intact. No rash noted.   ____________________________________________   LABS (all labs ordered are listed, but only abnormal results are displayed)  Labs Reviewed  URINALYSIS, COMPLETE (UACMP) WITH MICROSCOPIC - Abnormal; Notable for the following components:      Result Value   Color, Urine YELLOW (*)    APPearance CLEAR (*)    Protein, ur 100 (*)    All other components within normal limits  CBC - Abnormal; Notable for the following components:   RBC 3.82 (*)    Hemoglobin 12.6 (*)    HCT 37.9 (*)    RDW 19.9 (*)    All other components within normal limits  COMPREHENSIVE METABOLIC PANEL - Abnormal; Notable for the following components:   Glucose, Bld 196 (*)    BUN 22 (*)    ALT 8 (*)    All other components within normal limits  APTT - Abnormal; Notable for the following components:   aPTT 37 (*)    All other components within normal limits  CULTURE, BLOOD (ROUTINE X 2)  CULTURE, BLOOD (ROUTINE X 2)  TROPONIN I  PROTIME-INR  LACTIC ACID, PLASMA  LACTIC ACID, PLASMA   ____________________________________________  EKG   ____________________________________________  RADIOLOGY  CT head unremarkable ____________________________________________   PROCEDURES  Procedure(s) performed: No  Procedures   Critical Care performed: No ____________________________________________   INITIAL IMPRESSION / ASSESSMENT AND PLAN / ED COURSE  Pertinent labs & imaging results that were  available during my care of the patient were reviewed by me and considered in my medical decision making (see chart for details).  Patient presents with altered mental status.  Does not appear consistent with CVA however this is on the differential, more consistent with a metabolic encephalopathy.  We will send for CT head, check labs evaluate for infection and monitor  Temperature is 99.9 rectally however lab work is overall reassuring.  Chest x-ray demonstrates pulmonary fibrosis, discussed with radiologist we decided CT chest with demonstrate further detail  CT shows chronic pulmonary fibrosis, possible chronic granulomatous disease  Discussed with ID Dr. Sampson Goon  Patient has not returned to baseline, will admit to hospitalist, suspect early infection    ____________________________________________  FINAL CLINICAL IMPRESSION(S) / ED DIAGNOSES  Final diagnoses:  Altered mental status, unspecified altered mental status type  Chronic granulomatous disease (HCC)  Metabolic encephalopathy        Note:  This document was prepared using Dragon voice recognition software and may include unintentional dictation errors.    Jene EveryKinner, Brandilee Pies, MD 01/04/17 [phone]

## 2017-01-04 NOTE — H&P (Signed)
[employer] - [place] [facility]   PATIENT NAME: Donald Blanchard    MR#:  [id number]  DATE OF BIRTH:  1930/12/11  DATE OF ADMISSION:  01/04/2017  PRIMARY CARE PHYSICIAN: Patient, No Pcp Per   REQUESTING/REFERRING PHYSICIAN: Dr. Jene Every  CHIEF COMPLAINT:   Chief Complaint  Patient presents with  . Transient Ischemic Attack    HISTORY OF PRESENT ILLNESS:  Donald Blanchard  is a 84 y.o. male with a known history of PE, chronic atrial fibrillation, previous history of sciatica, previous history of nephrectomy who presents to the hospital brought in by his daughter due to altered mental status and not acting right. Patient lives in assisted living and the daughter says that he was not acting himself and therefore recommended he be brought to the ER. Patient is alert and somewhat oriented but still remains confused here in the ER. Patient's initial workup including CT head, urinalysis has been essentially normal. He did have a chest x-ray which showed some chronic findings suggestive of emphysema bronchiectasis and possible tuberculosis. Patient underwent a CT of the chest which was grossly abnormal showing evidence of chronic granulomatous disease. Patient although has no hemoptysis and no previous history of tuberculosis. Patient does have a low-grade fever here in the ER but no other associated complaints or symptoms presently.  Hospitalist services were contacted further treatment and evaluation.  PAST MEDICAL HISTORY:   Past Medical History:  Diagnosis Date  . A-fib (HCC)   . COPD (chronic obstructive pulmonary disease) (HCC)    not on home o2  . Emphysema of lung (HCC)   . Sciatica     PAST SURGICAL HISTORY:   Past Surgical History:  Procedure Laterality Date  . NEPHRECTOMY     Left- secondary to trauma  . right lung lesion removal      SOCIAL HISTORY:   Social History   Tobacco Use  . Smoking status: Former Smoker    Types: Cigarettes    Last attempt  to quit: 01/02/2004    Years since quitting: 13.0  . Tobacco comment: Quit 10 years ago  Substance Use Topics  . Alcohol use: No    Alcohol/week: 0.0 oz    FAMILY HISTORY:   Family History  Problem Relation Age of Onset  . Hypertension Father     DRUG ALLERGIES:  No Known Allergies  REVIEW OF SYSTEMS:   Review of Systems  Constitutional: Negative for fever and weight loss.  HENT: Negative for congestion, nosebleeds and tinnitus.   Eyes: Negative for blurred vision, double vision and redness.  Respiratory: Negative for cough, hemoptysis and shortness of breath.   Cardiovascular: Negative for chest pain, orthopnea, leg swelling and PND.  Gastrointestinal: Negative for abdominal pain, diarrhea, melena, nausea and vomiting.  Genitourinary: Negative for dysuria, hematuria and urgency.  Musculoskeletal: Negative for falls and joint pain.  Neurological: Negative for dizziness, tingling, sensory change, focal weakness, seizures, weakness and headaches.  Endo/Heme/Allergies: Negative for polydipsia. Does not bruise/bleed easily.  Psychiatric/Behavioral: Negative for depression and memory loss. The patient is not nervous/anxious.     MEDICATIONS AT HOME:   Prior to Admission medications   Medication Sig Start Date End Date Taking? Authorizing Provider  acetaminophen (TYLENOL) 325 MG tablet Take 650 mg by mouth every 6 (six) hours as needed.   Yes [provider]  mirtazapine (REMERON) 7.5 MG tablet Take 7.5 mg by mouth at bedtime.   Yes [provider]  omeprazole (PRILOSEC) 20 MG capsule Take  20 mg by mouth daily.   Yes [provider]  tiotropium (SPIRIVA) 18 MCG inhalation capsule Place 18 mcg into inhaler and inhale daily.   Yes [provider]  traMADol (ULTRAM) 50 MG tablet Take 50 mg by mouth 3 (three) times daily.   Yes [provider]  guaiFENesin (MUCINEX) 600 MG 12 hr tablet Take 1 tablet (600 mg total) by mouth 2 (two) times  daily. Patient not taking: Reported on 01/04/2017 01/08/15   Katharina Caper, MD  haloperidol (HALDOL) 1 MG tablet Take 1 tablet (1 mg total) by mouth every 8 (eight) hours as needed for agitation. Patient not taking: Reported on 01/04/2017 01/08/15   Katharina Caper, MD  ipratropium-albuterol (DUONEB) 0.5-2.5 (3) MG/3ML SOLN Take 3 mLs by nebulization every 6 (six) hours as needed. Patient not taking: Reported on 01/04/2017 01/08/15   Katharina Caper, MD  levofloxacin (LEVAQUIN) 750 MG tablet Take 1 tablet (750 mg total) by mouth every other day. Patient not taking: Reported on 01/04/2017 01/08/15   Katharina Caper, MD      VITAL SIGNS:  Blood pressure 119/70, pulse (!) 58, temperature 99.9 F (37.7 C), resp. rate 19, height 6\' 7"  (2.007 m), weight 61.2 kg (135 lb), SpO2 98 %.  PHYSICAL EXAMINATION:  Physical Exam  GENERAL:  84 y.o.-year-old thin patient lying in the bed in no acute distress.  EYES: Pupils equal, round, reactive to light and accommodation. No scleral icterus. Extraocular muscles intact.  HEENT: Head atraumatic, normocephalic. Oropharynx and nasopharynx clear. No oropharyngeal erythema, moist oral mucosa  NECK:  Supple, no jugular venous distention. No thyroid enlargement, no tenderness.  LUNGS: Normal breath sounds bilaterally, no wheezing, rales, rhonchi. No use of accessory muscles of respiration.  CARDIOVASCULAR: S1, S2 RRR. No murmurs, rubs, gallops, clicks.  ABDOMEN: Soft, nontender, nondistended. Bowel sounds present. No organomegaly or mass.  EXTREMITIES: No pedal edema, cyanosis, or clubbing. + 2 pedal & radial pulses b/l.   NEUROLOGIC: Cranial nerves II through XII are intact. No focal Motor or sensory deficits appreciated b/l. Globally weak.  PSYCHIATRIC: The patient is alert and oriented x 3.  SKIN: No obvious rash, lesion, or ulcer.   LABORATORY PANEL:   CBC Recent Labs  Lab 01/04/17 1018  WBC 7.0  HGB 12.6*  HCT 37.9*  PLT 203    ------------------------------------------------------------------------------------------------------------------  Chemistries  Recent Labs  Lab 01/04/17 1018  NA 140  K 3.8  CL 102  CO2 30  GLUCOSE 196*  BUN 22*  CREATININE 1.00  CALCIUM 9.4  AST 28  ALT 8*  ALKPHOS 72  BILITOT 1.0   ------------------------------------------------------------------------------------------------------------------  Cardiac Enzymes Recent Labs  Lab 01/04/17 1018  TROPONINI <0.03   ------------------------------------------------------------------------------------------------------------------  RADIOLOGY:  Ct Head Wo Contrast  Result Date: 01/04/2017 CLINICAL DATA:  Altered level of consciousness. EXAM: CT HEAD WITHOUT CONTRAST TECHNIQUE: Contiguous axial images were obtained from the base of the skull through the vertex without intravenous contrast. COMPARISON:  None. FINDINGS: Brain: Advanced atrophy and chronic small vessel disease changes. No acute intracranial abnormality. Specifically, no hemorrhage, hydrocephalus, mass lesion, acute infarction, or significant intracranial injury. Vascular: No hyperdense vessel or unexpected calcification. Skull: No acute calvarial abnormality. Sinuses/Orbits: Visualized paranasal sinuses and mastoids clear. Orbital soft tissues unremarkable. Other: None IMPRESSION: Advanced atrophy, chronic small vessel disease. No acute intracranial abnormality. Electronically Signed   By: Charlett Nose M.D.   On: 01/04/2017 10:45   Ct Chest W Contrast  Result Date: 01/04/2017 CLINICAL DATA:  Altered mental status.  Abnormal chest x-ray. EXAM: CT CHEST WITH CONTRAST TECHNIQUE: Multidetector CT imaging of the chest was performed during intravenous contrast administration. CONTRAST:  75mL ISOVUE-300 IOPAMIDOL (ISOVUE-300) INJECTION 61% COMPARISON:  Chest x-ray from earlier same day. Also chest x-ray dated 01/06/2015. FINDINGS: Cardiovascular: Heart size is normal. Diffuse  coronary artery calcifications. No pericardial effusion. Aortic atherosclerosis. No evidence of aortic dissection. Mediastinum/Nodes: No mass or enlarged lymph nodes seen within the mediastinum or perihilar regions. Esophagus is somewhat patulous but otherwise unremarkable. Trachea is unremarkable. Lungs/Pleura: There are patchy geographic consolidations within the upper lobes bilaterally with severe bronchiectasis and distortion, right greater than left with scattered nonspecific calcifications. This has progressed compared to earlier chest x-ray. Advanced pulmonary fibrosis noted at the lung bases. Spiculated nodular density within the superior segment of the right lower lobe, measuring 1.5 cm, most likely additional scarring/fibrosis contiguous with the right upper lobe dominant fibrosis but superimposed neoplastic lesion cannot be completely excluded (series 3, image 57). Upper Abdomen: No acute findings. Musculoskeletal: Degenerative changes throughout the scoliotic thoracolumbar spine. No acute or suspicious osseous finding. IMPRESSION: 1. Patchy geographic consolidations within the upper lobes bilaterally with severe bronchiectasis and distortion, right greater than left, with scattered nonspecific calcifications. This has progressed compared to earlier chest x-ray of 01/06/2015. Findings are most compatible with chronic granulomatous disease. Tuberculosis is a possibility, perhaps more likely MAI. Neoplastic process is considered less likely. Consider further characterization with bronchoscopy, sputum and/or QuantiFERON Gold if clinically indicated. 2. Spiculated nodular density within the superior segment of the right lower lobe, measuring 1.5 cm, most likely additional chronic scarring/fibrosis as it is nearly contiguous with the right upper lobe changes. Superimposed neoplastic lesion cannot be confidently excluded. Recommend follow-up chest CT in 3 months for this lesion to ensure stability. 3. Aortic  atherosclerosis. Electronically Signed   By: Bary Richard M.D.   On: 01/04/2017 12:41   Dg Chest Port 1 View  Addendum Date: 01/04/2017   ADDENDUM REPORT: 01/04/2017 11:07 ADDENDUM: These results were called by telephone at the time of interpretation on 01/04/2017 at 10:52 am to Dr. Jene Every , who verbally acknowledged these results. Electronically Signed   By: Ted Mcalpine M.D.   On: 01/04/2017 11:07   Result Date: 01/04/2017 CLINICAL DATA:  Altered mental status. History emphysema, COPD and AFib. EXAM: PORTABLE CHEST 1 VIEW COMPARISON:  01/06/2015 FINDINGS: Cardiomediastinal silhouette is normal. Mediastinal contours appear intact. Tortuosity and calcific atherosclerotic disease of the aorta. There is no evidence of pneumothorax. Advanced upper lobe predominant emphysematous changes and mild chronic interstitial lung disease. There are linear and peribronchial interstitial and airspace opacities in the right upper lobe extending to the pleural surfaces. Possible few mm pulmonary nodules are seen in the right mid lung field. Osseous structures are without acute abnormality. Soft tissues are grossly normal. IMPRESSION: Linear peribronchial airspace opacities in the right upper lobe extending to the pleural surfaces, with associated pleural thickening, on the background of advanced emphysematous changes and mild interstitial lung disease. These findings may represent infectious airspace consolidation, bronchogenic carcinoma, or granulomatous disease with pulmonary tuberculosis being a consideration. Please correlate clinically. Electronically Signed: By: Ted Mcalpine M.D. On: 01/04/2017 10:48     IMPRESSION AND PLAN:   84 year old male with past medical history of COPD, sciatica, previous history of nephrectomy who presents to the hospital from a group home due to altered mental status.  1. Altered mental status-etiology unclear presently. Patient CT head was negative on admission.  Urinalysis is negative, chest x-ray/CT  chest to show findings of chronic granulomatous disease but unlikely anything acute. -We'll empirically treat him for suspected pneumonia and follow mental status. -Questionable patient has underlying history of dementia could be contributing to the patient's mental status change. Patient was also recently started on tramadol which I will discontinue and hold for now.  2. Abnormal CT chest-patient has a CT chest done here in the ER which showed evidence of chronic granulomatous disease suspicious for MAI or possible tuberculosis. Patient does have a low-grade fever but denies any hemoptysis or night sweats. I will get a pulmonary consult to further evaluate.  3. GERD-continue Protonix.  4. COPD-no acute exacerbation. Continue Spiriva, DuoNeb nebs as needed.  5. Depression-continue Remeron.    All the records are reviewed and case discussed with ED provider. Management plans discussed with the patient, family and they are in agreement.  CODE STATUS: DO NOT RESUSCITATE  TOTAL TIME TAKING CARE OF THIS PATIENT: 45 minutes.    Houston SirenSAINANI,VIVEK J M.D on 01/04/2017 at 2:33 PM  Between 7am to 6pm - Pager - [phone]  After 6pm go to [email or web address] - password [email or web address]  [facility]  Office  [phone]  CC: Primary care physician; Patient, No Pcp Per

## 2017-01-04 NOTE — Progress Notes (Signed)
ANTIBIOTIC CONSULT NOTE - INITIAL  Pharmacy Consult for Levaquin  Indication: pneumonia  No Known Allergies  Patient Measurements: Height: 6\' 7"  (200.7 cm) Weight: 135 lb (61.2 kg) IBW/kg (Calculated) : 93.7 Adjusted Body Weight:   Vital Signs: Temp: 95.5 F (35.3 C) (01/04 1628) Temp Source: Oral (01/04 1628) BP: 144/62 (01/04 1628) Pulse Rate: 55 (01/04 1628) Intake/Output from previous day: No intake/output data recorded. Intake/Output from this shift: Total I/O In: 50 [IV Piggyback:50] Out: -   Labs: Recent Labs    01/04/17 1018  WBC 7.0  HGB 12.6*  PLT 203  CREATININE 1.00   Estimated Creatinine Clearance: 47.6 mL/min (by C-G formula based on SCr of 1 mg/dL). No results for input(s): VANCOTROUGH, VANCOPEAK, VANCORANDOM, GENTTROUGH, GENTPEAK, GENTRANDOM, TOBRATROUGH, TOBRAPEAK, TOBRARND, AMIKACINPEAK, AMIKACINTROU, AMIKACIN in the last 72 hours.   Microbiology: No results found for this or any previous visit (from the past 720 hour(s)).  Medical History: Past Medical History:  Diagnosis Date  . A-fib (HCC)   . COPD (chronic obstructive pulmonary disease) (HCC)    not on home o2  . Emphysema of lung (HCC)   . Sciatica     Medications:  Medications Prior to Admission  Medication Sig Dispense Refill Last Dose  . acetaminophen (TYLENOL) 325 MG tablet Take 650 mg by mouth every 6 (six) hours as needed.   prn at prn  . mirtazapine (REMERON) 7.5 MG tablet Take 7.5 mg by mouth at bedtime.   01/03/2017 at Unknown time  . omeprazole (PRILOSEC) 20 MG capsule Take 20 mg by mouth daily.   01/03/2017 at Unknown time  . tiotropium (SPIRIVA) 18 MCG inhalation capsule Place 18 mcg into inhaler and inhale daily.   01/03/2017 at Unknown time  . traMADol (ULTRAM) 50 MG tablet Take 50 mg by mouth 3 (three) times daily.   01/03/2017 at 2000  . guaiFENesin (MUCINEX) 600 MG 12 hr tablet Take 1 tablet (600 mg total) by mouth 2 (two) times daily. (Patient not taking: Reported on  01/04/2017) 20 tablet 0 Not Taking at Unknown time  . haloperidol (HALDOL) 1 MG tablet Take 1 tablet (1 mg total) by mouth every 8 (eight) hours as needed for agitation. (Patient not taking: Reported on 01/04/2017) 30 tablet 5 Not Taking at Unknown time  . ipratropium-albuterol (DUONEB) 0.5-2.5 (3) MG/3ML SOLN Take 3 mLs by nebulization every 6 (six) hours as needed. (Patient not taking: Reported on 01/04/2017) 360 mL 5 Not Taking at Unknown time  . levofloxacin (LEVAQUIN) 750 MG tablet Take 1 tablet (750 mg total) by mouth every other day. (Patient not taking: Reported on 01/04/2017) 4 tablet 0 Not Taking at Unknown time   Assessment: Pharmacy consulted to dose levaquin in this 84 year old male for CAP.  CrCl = 47.6 ml/min  Pt received azithromycin 500 mg X 1 on 1/4 @ 18:44.  Will start levaquin 24 hrs later on 1/5.  Goal of Therapy:  resolution of infection  Plan:  Expected duration 7 days with resolution of temperature and/or normalization of WBC   Levaquin 500 mg IV X 1 ordered to start on 1/5 @ 18:00 followed by levaquin 250 mg IV Q24H to start on 1/6.   Liane Tribbey D 01/04/2017,6:55 PM

## 2017-01-04 NOTE — ED Notes (Signed)
Pt assisted to toilet in room at this time 

## 2017-01-04 NOTE — Care Management (Signed)
Pending admission .  Patient to be admitted for AMS.  [facility] from [facility].  CSW notified.  Patient previously open with [facility]. R/o TB to be placed on airborne precautions and attempt to get 3 sputums for AFB

## 2017-01-05 ENCOUNTER — Observation Stay: Payer: Medicare Other

## 2017-01-05 LAB — BLOOD CULTURE ID PANEL (REFLEXED)
Acinetobacter baumannii: NOT DETECTED
CANDIDA ALBICANS: NOT DETECTED
CANDIDA GLABRATA: NOT DETECTED
CANDIDA TROPICALIS: NOT DETECTED
Candida krusei: NOT DETECTED
Candida parapsilosis: NOT DETECTED
ENTEROBACTER CLOACAE COMPLEX: NOT DETECTED
ENTEROBACTERIACEAE SPECIES: NOT DETECTED
Enterococcus species: NOT DETECTED
Escherichia coli: NOT DETECTED
Haemophilus influenzae: NOT DETECTED
KLEBSIELLA OXYTOCA: NOT DETECTED
KLEBSIELLA PNEUMONIAE: NOT DETECTED
Listeria monocytogenes: NOT DETECTED
Methicillin resistance: NOT DETECTED
Neisseria meningitidis: NOT DETECTED
PSEUDOMONAS AERUGINOSA: NOT DETECTED
Proteus species: NOT DETECTED
STREPTOCOCCUS PNEUMONIAE: NOT DETECTED
STREPTOCOCCUS PYOGENES: NOT DETECTED
STREPTOCOCCUS SPECIES: NOT DETECTED
Serratia marcescens: NOT DETECTED
Staphylococcus aureus (BCID): NOT DETECTED
Staphylococcus species: DETECTED — AB
Streptococcus agalactiae: NOT DETECTED

## 2017-01-05 LAB — CBC
HEMATOCRIT: 38.4 % — AB (ref 40.0–52.0)
HEMOGLOBIN: 12.8 g/dL — AB (ref 13.0–18.0)
MCH: 33.3 pg (ref 26.0–34.0)
MCHC: 33.4 g/dL (ref 32.0–36.0)
MCV: 99.6 fL (ref 80.0–100.0)
Platelets: 189 10*3/uL (ref 150–440)
RBC: 3.86 MIL/uL — AB (ref 4.40–5.90)
RDW: 19.6 % — ABNORMAL HIGH (ref 11.5–14.5)
WBC: 6.5 10*3/uL (ref 3.8–10.6)

## 2017-01-05 LAB — BASIC METABOLIC PANEL
ANION GAP: 10 (ref 5–15)
BUN: 20 mg/dL (ref 6–20)
CHLORIDE: 103 mmol/L (ref 101–111)
CO2: 28 mmol/L (ref 22–32)
Calcium: 8.9 mg/dL (ref 8.9–10.3)
Creatinine, Ser: 0.82 mg/dL (ref 0.61–1.24)
Glucose, Bld: 93 mg/dL (ref 65–99)
POTASSIUM: 3.8 mmol/L (ref 3.5–5.1)
SODIUM: 141 mmol/L (ref 135–145)

## 2017-01-05 MED ORDER — LEVOFLOXACIN IN D5W 500 MG/100ML IV SOLN
500.0000 mg | INTRAVENOUS | Status: DC
  Administered 2017-01-05: 500 mg via INTRAVENOUS
  Filled 2017-01-05 (×2): qty 100

## 2017-01-05 MED ORDER — ASPIRIN EC 81 MG PO TBEC
81.0000 mg | DELAYED_RELEASE_TABLET | Freq: Every day | ORAL | Status: DC
  Administered 2017-01-05: 81 mg via ORAL
  Filled 2017-01-05: qty 1

## 2017-01-05 NOTE — Evaluation (Signed)
Clinical/Bedside Swallow Evaluation Patient Details  Name: Donald LeveringJohn Blanchard MRN: [id number] Date of Birth: 06/11/1930  Today's Date: 01/05/2017 Time: SLP Start Time (ACUTE ONLY): 0912 SLP Stop Time (ACUTE ONLY): 0944 SLP Time Calculation (min) (ACUTE ONLY): 32 min  Past Medical History:  Past Medical History:  Diagnosis Date  . A-fib (HCC)   . COPD (chronic obstructive pulmonary disease) (HCC)    not on home o2  . Emphysema of lung (HCC)   . Sciatica    Past Surgical History:  Past Surgical History:  Procedure Laterality Date  . NEPHRECTOMY     Left- secondary to trauma  . right lung lesion removal     HPI:      Assessment / Plan / Recommendation Clinical Impression  pt presents with a moderate to severe pharyngeal phase dysphagia characterized by wet vocal quality post intake of small sip of thin liquids. pt did not appear to have awareness of wet vocal quality and required cuing to cough to clear wet vocal quality. pt appeared wfl for intake solids, however did avoid tougher foods.  pt did have a diagnosis of dysphagia in 2017 however unable to determine cause. pt denies having a previously diagnosed swallow status deficit. NSG administered medications in puree WFL. SLP Visit Diagnosis: Dysphagia, pharyngeal phase (R13.13)    Aspiration Risk  Moderate aspiration risk;Severe aspiration risk    Diet Recommendation Dysphagia 2 (Fine chop);Honey-thick liquid   Liquid Administration via: Cup;No straw Medication Administration: Whole meds with puree Supervision: Patient able to self feed Compensations: Slow rate;Small sips/bites;Minimize environmental distractions;Follow solids with liquid Postural Changes: Seated upright at 90 degrees    Other  Recommendations Other Recommendations: Remove water pitcher   Follow up Recommendations        Frequency and Duration min 4x/week  2 weeks       Prognosis Prognosis for Safe Diet Advancement: Fair Barriers to Reach Goals: Cognitive  deficits      Swallow Study   General Date of Onset: 01/04/17 Type of Study: Bedside Swallow Evaluation Diet Prior to this Study: Regular;Thin liquids Temperature Spikes Noted: No Respiratory Status: Room air History of Recent Intubation: No Behavior/Cognition: Cooperative;Pleasant mood Oral Cavity Assessment: Within Functional Limits Oral Care Completed by SLP: No Oral Cavity - Dentition: Adequate natural dentition Vision: Functional for self-feeding Self-Feeding Abilities: Able to feed self Patient Positioning: Upright in chair Baseline Vocal Quality: Breathy Volitional Cough: Strong Volitional Swallow: Able to elicit    Oral/Motor/Sensory Function Overall Oral Motor/Sensory Function: Within functional limits   Ice Chips Ice chips: Not tested   Thin Liquid Thin Liquid: Impaired Presentation: Cup;Self Fed Pharyngeal  Phase Impairments: Suspected delayed Swallow;Multiple swallows;Throat Clearing - Immediate;Wet Vocal Quality    Nectar Thick Nectar Thick Liquid: Not tested   Honey Thick Honey Thick Liquid: Within functional limits Presentation: Cup   Puree Puree: Within functional limits Presentation: Self Fed;Spoon   Solid   GO   Solid: Within functional limits Presentation: Spoon;Self Fed        Donald Blanchard 01/05/2017,10:30 AM

## 2017-01-05 NOTE — Progress Notes (Signed)
PHARMACY - PHYSICIAN COMMUNICATION CRITICAL VALUE ALERT - BLOOD CULTURE IDENTIFICATION (BCID)  Jeb LeveringJohn Hornberger is an 84 y.o. male who [facility] on 01/04/2017 with a chief complaint of altered mental status  Assessment: hypothermic, CXR showing some granulomatous disease (possibly MAI), CT/MRI head neg for stroke  (include suspected source if known)  Name of physician (or Provider) Contacted: Cammy CopaAngela Maier  Current antibiotics: Levaquin 500 mg IV q24h  Changes to prescribed antibiotics recommended:  Patient is on recommended antibiotics - No changes needed; patient is growing 1/4 bottles GPC staph species (MSSA) is on levaquin, this is appropriate for now considering concerns for granulomatous disease.  Results for orders placed or performed during the hospital encounter of 01/04/17  Blood Culture ID Panel (Reflexed) (Collected: 01/04/2017  1:27 PM)  Result Value Ref Range   Enterococcus species NOT DETECTED NOT DETECTED   Listeria monocytogenes NOT DETECTED NOT DETECTED   Staphylococcus species DETECTED (A) NOT DETECTED   Staphylococcus aureus NOT DETECTED NOT DETECTED   Methicillin resistance NOT DETECTED NOT DETECTED   Streptococcus species NOT DETECTED NOT DETECTED   Streptococcus agalactiae NOT DETECTED NOT DETECTED   Streptococcus pneumoniae NOT DETECTED NOT DETECTED   Streptococcus pyogenes NOT DETECTED NOT DETECTED   Acinetobacter baumannii NOT DETECTED NOT DETECTED   Enterobacteriaceae species NOT DETECTED NOT DETECTED   Enterobacter cloacae complex NOT DETECTED NOT DETECTED   Escherichia coli NOT DETECTED NOT DETECTED   Klebsiella oxytoca NOT DETECTED NOT DETECTED   Klebsiella pneumoniae NOT DETECTED NOT DETECTED   Proteus species NOT DETECTED NOT DETECTED   Serratia marcescens NOT DETECTED NOT DETECTED   Haemophilus influenzae NOT DETECTED NOT DETECTED   Neisseria meningitidis NOT DETECTED NOT DETECTED   Pseudomonas aeruginosa NOT DETECTED NOT DETECTED   Candida  albicans NOT DETECTED NOT DETECTED   Candida glabrata NOT DETECTED NOT DETECTED   Candida krusei NOT DETECTED NOT DETECTED   Candida parapsilosis NOT DETECTED NOT DETECTED   Candida tropicalis NOT DETECTED NOT DETECTED    Thomasene Rippleavid Cristo Ausburn, PharmD, BCPS Clinical Pharmacist 01/05/2017

## 2017-01-05 NOTE — Evaluation (Signed)
Physical Therapy Evaluation Patient Details Name: Donald LeveringJohn Blanchard MRN: [id number] DOB: 01/19/1930 Today's Date: 01/05/2017   History of Present Illness  84 yo male with onset of bronchiectasis and PNA was admitted with ANS and PMH:  chroninc granulomatous disease, atherosclerosis, chronic advanced brain atrophy, a-fib, COPD  Clinical Impression  Pt is up to walk a short trip on RW with PT assisting due to LE weakness but more from contractures of hips and trunk.  Pt is stiff and with some assistance could stand more upright but not alone.  Will recommend HHPT to follow up at ALF and to continue on stretches, balance and gait at [facility] until dc to same.    Follow Up Recommendations Home health PT;Supervision for mobility/OOB    Equipment Recommendations  Rolling walker with 5" wheels(if pt does not have adequate walker)    Recommendations for Other Services       Precautions / Restrictions Precautions Precautions: Fall Restrictions Weight Bearing Restrictions: No      Mobility  Bed Mobility Overal bed mobility: Needs Assistance Bed Mobility: Supine to Sit     Supine to sit: Min assist     General bed mobility comments: minor help to lift trunk and sit Side of bed  Transfers Overall transfer level: Needs assistance Equipment used: Rolling walker (2 wheeled);1 person hand held assist Transfers: Sit to/from Stand Sit to Stand: Mod assist         General transfer comment: mod assist to power up and min to shift to chair  Ambulation/Gait Ambulation/Gait assistance: Min assist Ambulation Distance (Feet): 4 Feet Assistive device: Rolling walker (2 wheeled);1 person hand held assist Gait Pattern/deviations: Step-to pattern;Wide base of support;Ataxic;Shuffle;Decreased stride length Gait velocity: reduced Gait velocity interpretation: Below normal speed for age/gender General Gait Details: mildly uncoordinated sidesteps with verbal and tactile cues bedside only  Stairs             Wheelchair Mobility    Modified Rankin (Stroke Patients Only) Modified Rankin (Stroke Patients Only) Pre-Morbid Rankin Score: Moderate disability Modified Rankin: Moderately severe disability     Balance Overall balance assessment: Needs assistance Sitting-balance support: Feet supported Sitting balance-Leahy Scale: Fair     Standing balance support: Bilateral upper extremity supported;During functional activity Standing balance-Leahy Scale: Poor                               Pertinent Vitals/Pain Pain Assessment: No/denies pain    Home Living Family/patient expects to be discharged to:: Assisted living               Home Equipment: Dan HumphreysWalker - 2 wheels(per pt who is confused)      Prior Function Level of Independence: Needs assistance   Gait / Transfers Assistance Needed: RW per pt but he is very debilitated with severe postural change  ADL's / Homemaking Assistance Needed: ALF staff to assist him although pt states he is doing bathing and dressing alone(pt is confused)        Hand Dominance   Dominant Hand: Right    Extremity/Trunk Assessment   Upper Extremity Assessment Upper Extremity Assessment: Generalized weakness    Lower Extremity Assessment Lower Extremity Assessment: Generalized weakness    Cervical / Trunk Assessment Cervical / Trunk Assessment: Kyphotic(severe, had to adjust chair back for him to eat)  Communication   Communication: Expressive difficulties(mumbling and not a clear answer to each question)  Cognition Arousal/Alertness: Lethargic Behavior During Therapy: Flat  affect Overall Cognitive Status: History of cognitive impairments - at baseline                                 General Comments: has advanced brain changes      General Comments      Exercises     Assessment/Plan    PT Assessment Patient needs continued PT services  PT Problem List Decreased strength;Decreased range of  motion;Decreased activity tolerance;Decreased balance;Decreased mobility;Decreased cognition;Decreased coordination;Decreased knowledge of use of DME;Decreased safety awareness;Decreased skin integrity       PT Treatment Interventions DME instruction;Gait training;Functional mobility training;Therapeutic activities;Therapeutic exercise;Balance training;Neuromuscular re-education;Patient/family education    PT Goals (Current goals can be found in the Care Plan section)  Acute Rehab PT Goals Patient Stated Goal: none stated PT Goal Formulation: Patient unable to participate in goal setting Time For Goal Achievement: 01/19/17 Potential to Achieve Goals: Good    Frequency Min 3X/week   Barriers to discharge Decreased caregiver support(ALF staff will have to do assisting on every movement ) ALF will have to assist him    Co-evaluation               AM-PAC PT "6 Clicks" Daily Activity  Outcome Measure Difficulty turning over in bed (including adjusting bedclothes, sheets and blankets)?: Unable Difficulty moving from lying on back to sitting on the side of the bed? : Unable Difficulty sitting down on and standing up from a chair with arms (e.g., wheelchair, bedside commode, etc,.)?: Unable Help needed moving to and from a bed to chair (including a wheelchair)?: A Lot Help needed walking in hospital room?: A Lot Help needed climbing 3-5 steps with a railing? : Total 6 Click Score: 8    End of Session Equipment Utilized During Treatment: Gait belt Activity Tolerance: Patient limited by fatigue;Other (comment)(LE and spinal contractures) Patient left: in chair;with call bell/phone within reach;with chair alarm set;Other (comment)(telesitter in room) Nurse Communication: Mobility status PT Visit Diagnosis: Other abnormalities of gait and mobility (R26.89);Unsteadiness on feet (R26.81);Muscle weakness (generalized) (M62.81);Adult, failure to thrive (R62.7);Apraxia (R48.2);Difficulty in  walking, not elsewhere classified (R26.2)    Time: 1610-9604 PT Time Calculation (min) (ACUTE ONLY): 29 min   Charges:   PT Evaluation $PT Eval Moderate Complexity: 1 Mod PT Treatments $Gait Training: 8-22 mins   PT G Codes:   PT G-Codes **NOT FOR INPATIENT CLASS** Functional Assessment Tool Used: AM-PAC 6 Clicks Basic Mobility    Ivar Drape 01/05/2017, 9:37 AM   Samul Dada, PT MS Acute Rehab Dept. Number: [facility] [phone] and [facility] [phone] Ascom [phone]

## 2017-01-05 NOTE — Progress Notes (Signed)
Patient ID: Donald Blanchard, male   DOB: 1930/06/24, 83 y.o.   MRN: [id number]  [employer] PROGRESS NOTE  Donald Blanchard WUJ:[id number] DOB: 10-02-1930 DOA: 01/04/2017 PCP: Patient, No Pcp Per  HPI/Subjective: Patient was brought in with altered mental status.  Patient states he is doing better.  He is able to answer questions.  But I did notice slurred speech.  Objective: Vitals:   01/05/17 0952 01/05/17 1350  BP: (!) 148/70 137/63  Pulse: 64 94  Resp: 18 18  Temp: 98.3 F (36.8 C) 98 F (36.7 C)  SpO2: 100% 92%    Filed Weights   01/04/17 1011  Weight: 61.2 kg (135 lb)    ROS: Review of Systems  Constitutional: Negative for chills and fever.  Eyes: Negative for blurred vision.  Respiratory: Positive for cough. Negative for shortness of breath.   Cardiovascular: Negative for chest pain.  Gastrointestinal: Negative for abdominal pain, constipation, diarrhea, nausea and vomiting.  Genitourinary: Negative for dysuria.  Musculoskeletal: Negative for joint pain.  Neurological: Positive for speech change. Negative for dizziness and headaches.   Exam: Physical Exam  Constitutional: He is oriented to person, place, and time.  HENT:  Nose: No mucosal edema.  Mouth/Throat: No oropharyngeal exudate or posterior oropharyngeal edema.  Eyes: Conjunctivae, EOM and lids are normal. Pupils are equal, round, and reactive to light.  Neck: No JVD present. Carotid bruit is not present. No edema present. No thyroid mass and no thyromegaly present.  Cardiovascular: S1 normal and S2 normal. Exam reveals no gallop.  No murmur heard. Pulses:      Dorsalis pedis pulses are 2+ on the right side, and 2+ on the left side.  Respiratory: No respiratory distress. He has no wheezes. He has no rhonchi. He has no rales.  GI: Soft. Bowel sounds are normal. There is no tenderness.  Musculoskeletal:       Right shoulder: He exhibits no swelling.  Lymphadenopathy:    He has no cervical adenopathy.   Neurological: He is alert and oriented to person, place, and time. No cranial nerve deficit.  Skin: Skin is warm. No rash noted. Nails show no clubbing.  Psychiatric: He has a normal mood and affect.      Data Reviewed: Basic Metabolic Panel: Recent Labs  Lab 01/04/17 1018 01/05/17 0314  NA 140 141  K 3.8 3.8  CL 102 103  CO2 30 28  GLUCOSE 196* 93  BUN 22* 20  CREATININE 1.00 0.82  CALCIUM 9.4 8.9   Liver Function Tests: Recent Labs  Lab 01/04/17 1018  AST 28  ALT 8*  ALKPHOS 72  BILITOT 1.0  PROT 8.0  ALBUMIN 4.3   CBC: Recent Labs  Lab 01/04/17 1018 01/05/17 0314  WBC 7.0 6.5  HGB 12.6* 12.8*  HCT 37.9* 38.4*  MCV 99.1 99.6  PLT 203 189   Cardiac Enzymes: Recent Labs  Lab 01/04/17 1018  TROPONINI <0.03     Recent Results (from the past 240 hour(s))  Blood culture (routine x 2)     Status: None (Preliminary result)   Collection Time: 01/04/17  1:27 PM  Result Value Ref Range Status   Specimen Description BLOOD LEFT ANTECUBITAL  Final   Special Requests   Final    BOTTLES DRAWN AEROBIC AND ANAEROBIC Blood Culture adequate volume   Culture   Final    NO GROWTH < 24 HOURS Performed at [facility], [address] [place], [place] [address]    Report Status PENDING  Incomplete  Blood culture (routine x 2)     Status: None (Preliminary result)   Collection Time: 01/04/17  1:27 PM  Result Value Ref Range Status   Specimen Description BLOOD BLOOD RIGHT FOREARM  Final   Special Requests   Final    BOTTLES DRAWN AEROBIC AND ANAEROBIC Blood Culture adequate volume   Culture   Final    NO GROWTH < 24 HOURS Performed at [facility], [address], [place], [place] [address]    Report Status PENDING  Incomplete     Studies: Ct Head Wo Contrast  Result Date: 01/04/2017 CLINICAL DATA:  Altered level of consciousness. EXAM: CT HEAD WITHOUT CONTRAST TECHNIQUE: Contiguous axial images were obtained from the base of the skull  through the vertex without intravenous contrast. COMPARISON:  None. FINDINGS: Brain: Advanced atrophy and chronic small vessel disease changes. No acute intracranial abnormality. Specifically, no hemorrhage, hydrocephalus, mass lesion, acute infarction, or significant intracranial injury. Vascular: No hyperdense vessel or unexpected calcification. Skull: No acute calvarial abnormality. Sinuses/Orbits: Visualized paranasal sinuses and mastoids clear. Orbital soft tissues unremarkable. Other: None IMPRESSION: Advanced atrophy, chronic small vessel disease. No acute intracranial abnormality. Electronically Signed   By: Charlett Nose M.D.   On: 01/04/2017 10:45   Ct Chest W Contrast  Result Date: 01/04/2017 CLINICAL DATA:  Altered mental status.  Abnormal chest x-ray. EXAM: CT CHEST WITH CONTRAST TECHNIQUE: Multidetector CT imaging of the chest was performed during intravenous contrast administration. CONTRAST:  75mL ISOVUE-300 IOPAMIDOL (ISOVUE-300) INJECTION 61% COMPARISON:  Chest x-ray from earlier same day. Also chest x-ray dated 01/06/2015. FINDINGS: Cardiovascular: Heart size is normal. Diffuse coronary artery calcifications. No pericardial effusion. Aortic atherosclerosis. No evidence of aortic dissection. Mediastinum/Nodes: No mass or enlarged lymph nodes seen within the mediastinum or perihilar regions. Esophagus is somewhat patulous but otherwise unremarkable. Trachea is unremarkable. Lungs/Pleura: There are patchy geographic consolidations within the upper lobes bilaterally with severe bronchiectasis and distortion, right greater than left with scattered nonspecific calcifications. This has progressed compared to earlier chest x-ray. Advanced pulmonary fibrosis noted at the lung bases. Spiculated nodular density within the superior segment of the right lower lobe, measuring 1.5 cm, most likely additional scarring/fibrosis contiguous with the right upper lobe dominant fibrosis but superimposed neoplastic  lesion cannot be completely excluded (series 3, image 57). Upper Abdomen: No acute findings. Musculoskeletal: Degenerative changes throughout the scoliotic thoracolumbar spine. No acute or suspicious osseous finding. IMPRESSION: 1. Patchy geographic consolidations within the upper lobes bilaterally with severe bronchiectasis and distortion, right greater than left, with scattered nonspecific calcifications. This has progressed compared to earlier chest x-ray of 01/06/2015. Findings are most compatible with chronic granulomatous disease. Tuberculosis is a possibility, perhaps more likely MAI. Neoplastic process is considered less likely. Consider further characterization with bronchoscopy, sputum and/or QuantiFERON Gold if clinically indicated. 2. Spiculated nodular density within the superior segment of the right lower lobe, measuring 1.5 cm, most likely additional chronic scarring/fibrosis as it is nearly contiguous with the right upper lobe changes. Superimposed neoplastic lesion cannot be confidently excluded. Recommend follow-up chest CT in 3 months for this lesion to ensure stability. 3. Aortic atherosclerosis. Electronically Signed   By: Bary  M.D.   On: 01/04/2017 12:41   Mr Brain Wo Contrast  Result Date: 01/05/2017 CLINICAL DATA:  Altered mental status.  Confusion.  Possible TIA. EXAM: MRI HEAD WITHOUT CONTRAST TECHNIQUE: Multiplanar, multiecho pulse sequences of the brain and surrounding structures were obtained without intravenous contrast. COMPARISON:  CT head 01/04/2017.  FINDINGS: Brain: No evidence for acute infarction, hemorrhage, mass lesion, hydrocephalus, or extra-axial fluid. Advanced atrophy. Extensive focal and confluent T2 and FLAIR hyperintensity, consistent with small vessel disease. Vascular: Normal flow voids. Skull and upper cervical spine: Normal marrow signal. Sinuses/Orbits: Negative.  BILATERAL cataract extraction. Other: None. IMPRESSION: No acute stroke is demonstrated.  There is atrophy and small vessel disease. Flow voids are maintained throughout. Electronically Signed   By: Elsie StainJohn T Curnes M.D.   On: 01/05/2017 11:38   Dg Chest Port 1 View  Addendum Date: 01/04/2017   ADDENDUM REPORT: 01/04/2017 11:07 ADDENDUM: These results were called by telephone at the time of interpretation on 01/04/2017 at 10:52 am to Dr. Jene EveryOBERT KINNER , who verbally acknowledged these results. Electronically Signed   By: Ted Mcalpineobrinka  Dimitrova M.D.   On: 01/04/2017 11:07   Result Date: 01/04/2017 CLINICAL DATA:  Altered mental status. History emphysema, COPD and AFib. EXAM: PORTABLE CHEST 1 VIEW COMPARISON:  01/06/2015 FINDINGS: Cardiomediastinal silhouette is normal. Mediastinal contours appear intact. Tortuosity and calcific atherosclerotic disease of the aorta. There is no evidence of pneumothorax. Advanced upper lobe predominant emphysematous changes and mild chronic interstitial lung disease. There are linear and peribronchial interstitial and airspace opacities in the right upper lobe extending to the pleural surfaces. Possible few mm pulmonary nodules are seen in the right mid lung field. Osseous structures are without acute abnormality. Soft tissues are grossly normal. IMPRESSION: Linear peribronchial airspace opacities in the right upper lobe extending to the pleural surfaces, with associated pleural thickening, on the background of advanced emphysematous changes and mild interstitial lung disease. These findings may represent infectious airspace consolidation, bronchogenic carcinoma, or granulomatous disease with pulmonary tuberculosis being a consideration. Please correlate clinically. Electronically Signed: By: Ted Mcalpineobrinka  Dimitrova M.D. On: 01/04/2017 10:48    Scheduled Meds: . aspirin EC  81 mg Oral Daily  . enoxaparin (LOVENOX) injection  40 mg Subcutaneous Q24H  . mirtazapine  7.5 mg Oral QHS  . pantoprazole  40 mg Oral Daily  . tiotropium  18 mcg Inhalation Daily   Continuous  Infusions: . levofloxacin (LEVAQUIN) IV      Assessment/Plan:  1. Acute encephalopathy and slurred speech.  I was concerned for stroke so I did an MRI of the brain which came back negative.  Acute encephalopathy could be infection related and the patient is on Levaquin for pneumonia.  Speech therapy downgraded diet. 2. Abnormal CT scan of the chest.  Suspected pneumonia.  Levaquin. 3. GERD on Protonix 4. COPD continue inhalers 5. Depression on Remeron 6. Weakness physical therapy recommended home with home health.  Code Status:     Code Status Orders  (From admission, onward)        Start     Ordered   01/04/17 1433  Do not attempt resuscitation (DNR)  Continuous    Question Answer Comment  In the event of cardiac or respiratory ARREST Do not call a "code blue"   In the event of cardiac or respiratory ARREST Do not perform Intubation, CPR, defibrillation or ACLS   In the event of cardiac or respiratory ARREST Use medication by any route, position, wound care, and other measures to relive pain and suffering. May use oxygen, suction and manual treatment of airway obstruction as needed for comfort.      01/04/17 1433    Code Status History    Date Active Date Inactive Code Status Order ID Comments User Context   01/06/2015 22:09 01/08/2015 17:09 Full Code [id number]  Nemiah CommanderKalisetti,  [place], MD Inpatient    Advance Directive Documentation     Most Recent Value  Type of Advance Directive  Out of facility DNR (pink MOST or yellow form)  Pre-existing out of facility DNR order (yellow form or pink MOST form)  Yellow form placed in chart (order not valid for inpatient use)  "MOST" Form in Place?  No data     Family Communication: Spoke with daughter on the phone and son-in-law who is Dr. Arlana Pouch. Disposition Plan: Potentially home with home health tomorrow if afebrile and mental status is the same.  Antibiotics:  Levaquin  Time spent: 28 minutes  Cristan Hout [employer]

## 2017-01-05 NOTE — Progress Notes (Signed)
ANTIBIOTIC CONSULT NOTE - INITIAL  Pharmacy Consult for Levaquin  Indication: pneumonia  No Known Allergies  Patient Measurements: Height: 6\' 7"  (200.7 cm) Weight: 135 lb (61.2 kg) IBW/kg (Calculated) : 93.7 Adjusted Body Weight:   Vital Signs: Temp: 98 F (36.7 C) (01/05 0416) Temp Source: Oral (01/05 0416) BP: 127/63 (01/05 0416) Pulse Rate: 53 (01/05 0416) Intake/Output from previous day: 01/04 0701 - 01/05 0700 In: 50 [IV Piggyback:50] Out: -  Intake/Output from this shift: No intake/output data recorded.  Labs: Recent Labs    01/04/17 1018 01/05/17 0314  WBC 7.0 6.5  HGB 12.6* 12.8*  PLT 203 189  CREATININE 1.00 0.82   Estimated Creatinine Clearance: 58 mL/min (by C-G formula based on SCr of 0.82 mg/dL). No results for input(s): VANCOTROUGH, VANCOPEAK, VANCORANDOM, GENTTROUGH, GENTPEAK, GENTRANDOM, TOBRATROUGH, TOBRAPEAK, TOBRARND, AMIKACINPEAK, AMIKACINTROU, AMIKACIN in the last 72 hours.   Microbiology: No results found for this or any previous visit (from the past 720 hour(s)).  Medical History: Past Medical History:  Diagnosis Date  . A-fib (HCC)   . COPD (chronic obstructive pulmonary disease) (HCC)    not on home o2  . Emphysema of lung (HCC)   . Sciatica     Medications:  Medications Prior to Admission  Medication Sig Dispense Refill Last Dose  . acetaminophen (TYLENOL) 325 MG tablet Take 650 mg by mouth every 6 (six) hours as needed.   prn at prn  . mirtazapine (REMERON) 7.5 MG tablet Take 7.5 mg by mouth at bedtime.   01/03/2017 at Unknown time  . omeprazole (PRILOSEC) 20 MG capsule Take 20 mg by mouth daily.   01/03/2017 at Unknown time  . tiotropium (SPIRIVA) 18 MCG inhalation capsule Place 18 mcg into inhaler and inhale daily.   01/03/2017 at Unknown time  . traMADol (ULTRAM) 50 MG tablet Take 50 mg by mouth 3 (three) times daily.   01/03/2017 at 2000  . guaiFENesin (MUCINEX) 600 MG 12 hr tablet Take 1 tablet (600 mg total) by mouth 2 (two) times  daily. (Patient not taking: Reported on 01/04/2017) 20 tablet 0 Not Taking at Unknown time  . haloperidol (HALDOL) 1 MG tablet Take 1 tablet (1 mg total) by mouth every 8 (eight) hours as needed for agitation. (Patient not taking: Reported on 01/04/2017) 30 tablet 5 Not Taking at Unknown time  . ipratropium-albuterol (DUONEB) 0.5-2.5 (3) MG/3ML SOLN Take 3 mLs by nebulization every 6 (six) hours as needed. (Patient not taking: Reported on 01/04/2017) 360 mL 5 Not Taking at Unknown time  . levofloxacin (LEVAQUIN) 750 MG tablet Take 1 tablet (750 mg total) by mouth every other day. (Patient not taking: Reported on 01/04/2017) 4 tablet 0 Not Taking at Unknown time   Assessment: Pharmacy consulted to dose levaquin in this 84 year old male for CAP.  CrCl = 47.6 ml/min  Pt received azithromycin 500 mg X 1 on 1/4 @ 18:44.  Will start levaquin 24 hrs later on 1/5.  Goal of Therapy:  resolution of infection  Plan:  Expected duration 7 days with resolution of temperature and/or normalization of WBC   Levaquin 500 mg IV X 1 ordered to start on 1/5 @ 18:00 followed by levaquin 250 mg IV Q24H to start on 1/6.    01/05 @ 0300 Scr 0.88 CrCl 58 ml/min will readjust levaquin back to 500 mg po daily starting 1/5 @ 1800.  Thomasene Rippleavid Marolyn Urschel, PharmD, BCPS Clinical Pharmacist 01/05/2017

## 2017-01-06 ENCOUNTER — Observation Stay: Admit: 2017-01-06 | Payer: Medicare Other

## 2017-01-06 MED ORDER — QUETIAPINE FUMARATE 25 MG PO TABS: 12.5000 mg | ORAL_TABLET | Freq: Every day | ORAL | Status: DC

## 2017-01-06 MED ORDER — LEVOFLOXACIN 500 MG PO TABS
500.0000 mg | ORAL_TABLET | Freq: Every day | ORAL | Status: DC
  Administered 2017-01-06: 500 mg via ORAL
  Filled 2017-01-06: qty 1

## 2017-01-06 MED ORDER — QUETIAPINE FUMARATE 25 MG PO TABS: 12.5000 mg | ORAL_TABLET | Freq: Every day | ORAL | 0 refills | Status: DC

## 2017-01-06 MED ORDER — LEVOFLOXACIN 500 MG PO TABS: 500.0000 mg | ORAL_TABLET | Freq: Every day | ORAL | 0 refills | Status: DC

## 2017-01-06 NOTE — Clinical Social Work Note (Addendum)
[facility] will [facility] today [facility]. CSW has called the clinical coordinator to discuss transportation as requested by the patient's daughter. CSW left VM and is waiting for a return call. CSW will deliver the packet when confirmation of discharge and FL 2 are available.  Addendum: The patient's daughter will transport the patient to his facility. CSW will deliver the packet as soon as possible. CSW is signing off. Please consult should additional needs arise.  Argentina PonderKaren Martha Aliviyah Malanga, MSW, Theresia MajorsLCSWA [phone]

## 2017-01-06 NOTE — Progress Notes (Signed)
Pts daughter Aram BeechamCynthia notified pt is ready for pick-up. Pt wheeled to car by staff, daughter transporting [facility] to [facility].

## 2017-01-06 NOTE — [place] FL2 (Signed)
[place] MEDICAID FL2 LEVEL OF CARE SCREENING TOOL     IDENTIFICATION  Patient Name: Donald Blanchard Birthdate: January 10, 1930 Sex: male Admission Date (Current Location): 01/04/2017  [facility] and [place] Number:  Chiropodist and Address:  [facility], [address] [place], [place] [address]      Provider Number: [id number]  Attending Physician Name and Address:  Alford Highland, MD  Relative Name and Phone Number:       Current Level of Care: Hospital Recommended Level of Care: Assisted Living Facility Prior Approval Number:    Date Approved/Denied:   PASRR Number: [id number]  Discharge Plan: Domiciliary (Rest home)    Current Diagnoses: Patient Active Problem List   Diagnosis Date Noted  . Altered mental status 01/04/2017  . Right upper lobe pneumonia (HCC) 01/08/2015  . Emphysema lung (HCC) 01/08/2015  . Leukocytosis 01/08/2015  . Anemia 01/08/2015  . Atrial fibrillation (HCC) 01/08/2015  . Dementia with behavioral disturbance 01/08/2015  . Dysphagia 01/08/2015  . Sepsis (HCC) 01/06/2015    Orientation RESPIRATION BLADDER Height & Weight     Self, Place  Normal Continent Weight: 135 lb (61.2 kg) Height:  6\' 7"  (200.7 cm)  BEHAVIORAL SYMPTOMS/MOOD NEUROLOGICAL BOWEL NUTRITION STATUS      Continent Diet(Regular Diet. )  AMBULATORY STATUS COMMUNICATION OF NEEDS Skin   Supervision Verbally Normal                       Personal Care Assistance Level of Assistance  Bathing, Feeding, Dressing Bathing Assistance: Limited assistance Feeding assistance: Independent Dressing Assistance: Limited assistance     Functional Limitations Info  Sight, Hearing, Speech Sight Info: Adequate Hearing Info: Adequate Speech Info: Adequate    SPECIAL CARE FACTORS FREQUENCY  PT (By licensed PT)     PT Frequency: (2-3 home health. )              Contractures      Additional Factors Info  Code Status, Allergies, Isolation  Precautions Code Status Info: (DNR ) Allergies Info: (No Known Allergies. )     Isolation Precautions Info: (Airborne precautions to rule out TB. )     Current Medications (01/06/2017):  This is the current hospital active medication list Current Facility-Administered Medications  Medication Dose Route Frequency Provider Last Rate Last Dose  . acetaminophen (TYLENOL) tablet 650 mg  650 mg Oral Q6H PRN Houston Siren, MD       Or  . acetaminophen (TYLENOL) suppository 650 mg  650 mg Rectal Q6H PRN Houston Siren, MD      . enoxaparin (LOVENOX) injection 40 mg  40 mg Subcutaneous Q24H Houston Siren, MD   40 mg at 01/05/17 2142  . ipratropium-albuterol (DUONEB) 0.5-2.5 (3) MG/3ML nebulizer solution 3 mL  3 mL Nebulization Q6H PRN Houston Siren, MD      . levofloxacin (LEVAQUIN) tablet 500 mg  500 mg Oral Daily Wieting, Richard, MD      . mirtazapine (REMERON) tablet 7.5 mg  7.5 mg Oral QHS Houston Siren, MD   7.5 mg at 01/05/17 2142  . ondansetron (ZOFRAN) tablet 4 mg  4 mg Oral Q6H PRN Houston Siren, MD       Or  . ondansetron (ZOFRAN) injection 4 mg  4 mg Intravenous Q6H PRN Sainani, Rolly Pancake, MD      . pantoprazole (PROTONIX) EC tablet 40 mg  40 mg Oral Daily Houston Siren, MD  40 mg at 01/05/17 0954  . QUEtiapine (SEROQUEL) tablet 12.5 mg  12.5 mg Oral QHS Wieting, Richard, MD      . tiotropium [facility](SPIRIVA) inhalation capsule 18 mcg  18 mcg Inhalation Daily Houston SirenSainani, Vivek J, MD   18 mcg at 01/06/17 0815     Discharge Medications: STOP taking these medications   guaiFENesin 600 MG 12 hr tablet Commonly known as:  MUCINEX   haloperidol 1 MG tablet Commonly known as:  HALDOL   traMADol 50 MG tablet Commonly known as:  ULTRAM     TAKE these medications   acetaminophen 325 MG tablet Commonly known as:  TYLENOL Take 650 mg by mouth every 6 (six) hours as needed.   ipratropium-albuterol 0.5-2.5 (3) MG/3ML Soln Commonly known as:  DUONEB Take 3 mLs by  nebulization every 6 (six) hours as needed.   levofloxacin 500 MG tablet Commonly known as:  LEVAQUIN Take 1 tablet (500 mg total) by mouth daily. What changed:    medication strength  how much to take  when to take this   mirtazapine 7.5 MG tablet Commonly known as:  REMERON Take 7.5 mg by mouth at bedtime.   omeprazole 20 MG capsule Commonly known as:  PRILOSEC Take 20 mg by mouth daily.   QUEtiapine 25 MG tablet Commonly known as:  SEROQUEL Take 0.5 tablets (12.5 mg total) by mouth at bedtime.   tiotropium 18 MCG inhalation capsule Commonly known as:  SPIRIVA Place 18 mcg into inhaler and inhale daily.         Relevant Imaging Results:  Relevant Lab Results:   Additional Information (SSN: [id number])  Judi CongKaren M Aashi Derrington, LCSW

## 2017-01-06 NOTE — Care Management Note (Signed)
Case Management Note  Patient Details  Name: Donald LeveringJohn Blanchard MRN: [id number] Date of Birth: 05/18/1930  Subjective/Objective:     Family to transport [facility]Dearmon [facility]. [facility]. No other needs identified.                Action/Plan:   Expected Discharge Date:  01/06/17               Expected Discharge Plan:  Home w Home Health Services  In-House Referral:     Discharge planning Services  CM Consult  Post Acute Care Choice:  Home Health Choice offered to:  Patient  DME Arranged:    DME Agency:     HH Arranged:  PT HH Agency:  [facility]  Status of Service:  Completed, signed off  If discussed at Long Length of Stay Meetings, dates discussed:    Additional Comments:  Darrel Gloss A, RN 01/06/2017, 10:09 AM

## 2017-01-06 NOTE — Discharge Summary (Addendum)
[facility] - [place] [facility]   PATIENT NAME: Donald LeveringJohn Blanchard    MR#:  [id number]  DATE OF BIRTH:  09/01/1930  DATE OF ADMISSION:  01/04/2017 ADMITTING PHYSICIAN: Enid Baasadhika Kalisetti, MD  DATE OF DISCHARGE: 01/06/2017  PRIMARY CARE PHYSICIAN: Doctor at facility   ADMISSION DIAGNOSIS:  Metabolic encephalopathy [G93.41] Chronic granulomatous disease (HCC) [D71] Altered mental status, unspecified altered mental status type [R41.82]  DISCHARGE DIAGNOSIS:  Active Problems:   Altered mental status   SECONDARY DIAGNOSIS:   Past Medical History:  Diagnosis Date  . A-fib (HCC)   . COPD (chronic obstructive pulmonary disease) (HCC)    not on home o2  . Emphysema of lung (HCC)   . Sciatica     HOSPITAL COURSE:   1.  Acute encephalopathy with slurred speech.  MRI of the brain was negative for acute stroke.  Acute encephalopathy could be infection related.  The patient is on Levaquin for pneumonia.  Speech therapy downgraded diet to dysphagia 2 diet with honey thickened liquids.  Insomnia could be part of the issue.  Start low-dose Seroquel at night.  Seroquel can cause some morning grogginess.  Slurred speech could be a vocal cord issue.  Continue Protonix.  Continue dysphagia diet.  Would not give any medications that can altered mental status.  Tramadol was discontinued. 2.  Abnormal CT scan of the chest.  Suspected pneumonia finish up the Levaquin course. 3.  GERD on Protonix 4.  COPD continue inhalers and nebulizer treatments 5.  Weakness.  Physical therapy recommended home health.  Assistance with getting up from bed and ambulation. 6.  Depression on Remeron 7.  History of atrial fibrillation 8.  Blood culture showing staph species in 1 bottle is likely a skin contaminant and no further treatment needed.  Recommend palliative care consultation at facility.  Patient is already a DO NOT RESUSCITATE.  It is very hard to stay hydrated on dysphagia diet with thickened  liquids.   Recommend speech therapy to continue following the patient.   DISCHARGE CONDITIONS:   Fair  CONSULTS OBTAINED:  Treatment Team:  Shane Crutchamachandran, Pradeep, MD Mick SellFitzgerald, David P, MD  DRUG ALLERGIES:  No Known Allergies  DISCHARGE MEDICATIONS:   Allergies as of 01/06/2017   No Known Allergies     Medication List    STOP taking these medications   guaiFENesin 600 MG 12 hr tablet Commonly known as:  MUCINEX   haloperidol 1 MG tablet Commonly known as:  HALDOL   traMADol 50 MG tablet Commonly known as:  ULTRAM     TAKE these medications   acetaminophen 325 MG tablet Commonly known as:  TYLENOL Take 650 mg by mouth every 6 (six) hours as needed.   ipratropium-albuterol 0.5-2.5 (3) MG/3ML Soln Commonly known as:  DUONEB Take 3 mLs by nebulization every 6 (six) hours as needed.   levofloxacin 500 MG tablet Commonly known as:  LEVAQUIN Take 1 tablet (500 mg total) by mouth daily. What changed:    medication strength  how much to take  when to take this   mirtazapine 7.5 MG tablet Commonly known as:  REMERON Take 7.5 mg by mouth at bedtime.   omeprazole 20 MG capsule Commonly known as:  PRILOSEC Take 20 mg by mouth daily.   QUEtiapine 25 MG tablet Commonly known as:  SEROQUEL Take 0.5 tablets (12.5 mg total) by mouth at bedtime.   tiotropium 18 MCG inhalation capsule Commonly known as:  SPIRIVA Place 18 mcg into inhaler and  inhale daily.        DISCHARGE INSTRUCTIONS:   Follow-up with Dr. at facility in 1 day Follow-up with physical therapy at facility  If you experience worsening of your admission symptoms, develop shortness of breath, life threatening emergency, suicidal or homicidal thoughts you must seek medical attention immediately by calling 911 or calling your MD immediately  if symptoms less severe.  You Must read complete instructions/literature along with all the possible adverse reactions/side effects for all the Medicines  you take and that have been prescribed to you. Take any new Medicines after you have completely understood and accept all the possible adverse reactions/side effects.   Please note  You were cared for by a hospitalist during your hospital stay. If you have any questions about your discharge medications or the care you received while you were in the hospital after you are discharged, you can call the unit and asked to speak with the hospitalist on call if the hospitalist that took care of you is not available. Once you are discharged, your primary care physician will handle any further medical issues. Please note that NO REFILLS for any discharge medications will be authorized once you are discharged, as it is imperative that you return to your primary care physician (or establish a relationship with a primary care physician if you do not have one) for your aftercare needs so that they can reassess your need for medications and monitor your lab values.    Today   CHIEF COMPLAINT:   Chief Complaint  Patient presents with  . Transient Ischemic Attack    HISTORY OF PRESENT ILLNESS:  Donald Blanchard  is a 84 y.o. male presented with altered mental status   VITAL SIGNS:  Blood pressure (!) 159/63, pulse 64, temperature 97.6 F (36.4 C), temperature source Oral, resp. rate 19, height 6\' 7"  (2.007 m), weight 61.2 kg (135 lb), SpO2 100 %.    PHYSICAL EXAMINATION:  GENERAL:  84 y.o.-year-old patient lying in the bed with no acute distress.  EYES: Pupils equal, round, reactive to light and accommodation. No scleral icterus. HEENT: Head atraumatic, normocephalic. Oropharynx and nasopharynx clear.  NECK:  Supple, no jugular venous distention. No thyroid enlargement, no tenderness.  LUNGS: Normal breath sounds bilaterally, no wheezing, rales,rhonchi or crepitation. No use of accessory muscles of respiration.  CARDIOVASCULAR: S1, S2 normal. No murmurs, rubs, or gallops.  ABDOMEN: Soft, non-tender,  non-distended. Bowel sounds present. No organomegaly or mass.  EXTREMITIES: No pedal edema, cyanosis, or clubbing.  NEUROLOGIC: Slurred speech still present.  Gait not checked. PSYCHIATRIC: The patient is alert and answers questions.  SKIN: No obvious rash, lesion, or ulcer.   DATA REVIEW:   CBC Recent Labs  Lab 01/05/17 0314  WBC 6.5  HGB 12.8*  HCT 38.4*  PLT 189    Chemistries  Recent Labs  Lab 01/04/17 1018 01/05/17 0314  NA 140 141  K 3.8 3.8  CL 102 103  CO2 30 28  GLUCOSE 196* 93  BUN 22* 20  CREATININE 1.00 0.82  CALCIUM 9.4 8.9  AST 28  --   ALT 8*  --   ALKPHOS 72  --   BILITOT 1.0  --     Cardiac Enzymes Recent Labs  Lab 01/04/17 1018  TROPONINI <0.03    Microbiology Results  Results for orders placed or performed during the hospital encounter of 01/04/17  Blood culture (routine x 2)     Status: None (Preliminary result)   Collection Time:  01/04/17  1:27 PM  Result Value Ref Range Status   Specimen Description   Final    BLOOD LEFT ANTECUBITAL Performed at [facility], [address], [place], [place] [address]    Special Requests   Final    BOTTLES DRAWN AEROBIC AND ANAEROBIC Blood Culture adequate volume Performed at [facility], [address] [place], [place] [address]    Culture  Setup Time   Final    GRAM POSITIVE COCCI AEROBIC BOTTLE ONLY CRITICAL RESULT CALLED TO, READ BACK BY AND VERIFIED WITH: GARRETT COFFEE AT 2132 01/05/17.PMH    Culture GRAM POSITIVE COCCI  Final   Report Status PENDING  Incomplete  Blood culture (routine x 2)     Status: None (Preliminary result)   Collection Time: 01/04/17  1:27 PM  Result Value Ref Range Status   Specimen Description BLOOD BLOOD RIGHT FOREARM  Final   Special Requests   Final    BOTTLES DRAWN AEROBIC AND ANAEROBIC Blood Culture adequate volume   Culture   Final    NO GROWTH 2 DAYS Performed at [facility], [address], [place], [place] [address]     Report Status PENDING  Incomplete  Blood Culture ID Panel (Reflexed)     Status: Abnormal   Collection Time: 01/04/17  1:27 PM  Result Value Ref Range Status   Enterococcus species NOT DETECTED NOT DETECTED Final   Listeria monocytogenes NOT DETECTED NOT DETECTED Final   Staphylococcus species DETECTED (A) NOT DETECTED Final    Comment: Methicillin (oxacillin) susceptible coagulase negative staphylococcus. Possible blood culture contaminant (unless isolated from more than one blood culture draw or clinical case suggests pathogenicity). No antibiotic treatment is indicated for blood  culture contaminants. CRITICAL RESULT CALLED TO, READ BACK BY AND VERIFIED WITH: GARRETT COFFEE AT 2132 01/05/17.PMH    Staphylococcus aureus NOT DETECTED NOT DETECTED Final   Methicillin resistance NOT DETECTED NOT DETECTED Final   Streptococcus species NOT DETECTED NOT DETECTED Final   Streptococcus agalactiae NOT DETECTED NOT DETECTED Final   Streptococcus pneumoniae NOT DETECTED NOT DETECTED Final   Streptococcus pyogenes NOT DETECTED NOT DETECTED Final   Acinetobacter baumannii NOT DETECTED NOT DETECTED Final   Enterobacteriaceae species NOT DETECTED NOT DETECTED Final   Enterobacter cloacae complex NOT DETECTED NOT DETECTED Final   Escherichia coli NOT DETECTED NOT DETECTED Final   Klebsiella oxytoca NOT DETECTED NOT DETECTED Final   Klebsiella pneumoniae NOT DETECTED NOT DETECTED Final   Proteus species NOT DETECTED NOT DETECTED Final   Serratia marcescens NOT DETECTED NOT DETECTED Final   Haemophilus influenzae NOT DETECTED NOT DETECTED Final   Neisseria meningitidis NOT DETECTED NOT DETECTED Final   Pseudomonas aeruginosa NOT DETECTED NOT DETECTED Final   Candida albicans NOT DETECTED NOT DETECTED Final   Candida glabrata NOT DETECTED NOT DETECTED Final   Candida krusei NOT DETECTED NOT DETECTED Final   Candida parapsilosis NOT DETECTED NOT DETECTED Final   Candida tropicalis NOT DETECTED NOT  DETECTED Final    Comment: Performed at [facility], [address] [place], [place] [address]    RADIOLOGY:  Ct Head Wo Contrast  Result Date: 01/04/2017 CLINICAL DATA:  Altered level of consciousness. EXAM: CT HEAD WITHOUT CONTRAST TECHNIQUE: Contiguous axial images were obtained from the base of the skull through the vertex without intravenous contrast. COMPARISON:  None. FINDINGS: Brain: Advanced atrophy and chronic small vessel disease changes. No acute intracranial abnormality. Specifically, no hemorrhage, hydrocephalus, mass lesion, acute infarction, or  significant intracranial injury. Vascular: No hyperdense vessel or unexpected calcification. Skull: No acute calvarial abnormality. Sinuses/Orbits: Visualized paranasal sinuses and mastoids clear. Orbital soft tissues unremarkable. Other: None IMPRESSION: Advanced atrophy, chronic small vessel disease. No acute intracranial abnormality. Electronically Signed   By: Charlett Nose M.D.   On: 01/04/2017 10:45   Ct Chest W Contrast  Result Date: 01/04/2017 CLINICAL DATA:  Altered mental status.  Abnormal chest x-ray. EXAM: CT CHEST WITH CONTRAST TECHNIQUE: Multidetector CT imaging of the chest was performed during intravenous contrast administration. CONTRAST:  75mL ISOVUE-300 IOPAMIDOL (ISOVUE-300) INJECTION 61% COMPARISON:  Chest x-ray from earlier same day. Also chest x-ray dated 01/06/2015. FINDINGS: Cardiovascular: Heart size is normal. Diffuse coronary artery calcifications. No pericardial effusion. Aortic atherosclerosis. No evidence of aortic dissection. Mediastinum/Nodes: No mass or enlarged lymph nodes seen within the mediastinum or perihilar regions. Esophagus is somewhat patulous but otherwise unremarkable. Trachea is unremarkable. Lungs/Pleura: There are patchy geographic consolidations within the upper lobes bilaterally with severe bronchiectasis and distortion, right greater than left with scattered nonspecific calcifications.  This has progressed compared to earlier chest x-ray. Advanced pulmonary fibrosis noted at the lung bases. Spiculated nodular density within the superior segment of the right lower lobe, measuring 1.5 cm, most likely additional scarring/fibrosis contiguous with the right upper lobe dominant fibrosis but superimposed neoplastic lesion cannot be completely excluded (series 3, image 57). Upper Abdomen: No acute findings. Musculoskeletal: Degenerative changes throughout the scoliotic thoracolumbar spine. No acute or suspicious osseous finding. IMPRESSION: 1. Patchy geographic consolidations within the upper lobes bilaterally with severe bronchiectasis and distortion, right greater than left, with scattered nonspecific calcifications. This has progressed compared to earlier chest x-ray of 01/06/2015. Findings are most compatible with chronic granulomatous disease. Tuberculosis is a possibility, perhaps more likely MAI. Neoplastic process is considered less likely. Consider further characterization with bronchoscopy, sputum and/or QuantiFERON Gold if clinically indicated. 2. Spiculated nodular density within the superior segment of the right lower lobe, measuring 1.5 cm, most likely additional chronic scarring/fibrosis as it is nearly contiguous with the right upper lobe changes. Superimposed neoplastic lesion cannot be confidently excluded. Recommend follow-up chest CT in 3 months for this lesion to ensure stability. 3. Aortic atherosclerosis. Electronically Signed   By: Bary  M.D.   On: 01/04/2017 12:41   Mr Brain Wo Contrast  Result Date: 01/05/2017 CLINICAL DATA:  Altered mental status.  Confusion.  Possible TIA. EXAM: MRI HEAD WITHOUT CONTRAST TECHNIQUE: Multiplanar, multiecho pulse sequences of the brain and surrounding structures were obtained without intravenous contrast. COMPARISON:  CT head 01/04/2017. FINDINGS: Brain: No evidence for acute infarction, hemorrhage, mass lesion, hydrocephalus, or  extra-axial fluid. Advanced atrophy. Extensive focal and confluent T2 and FLAIR hyperintensity, consistent with small vessel disease. Vascular: Normal flow voids. Skull and upper cervical spine: Normal marrow signal. Sinuses/Orbits: Negative.  BILATERAL cataract extraction. Other: None. IMPRESSION: No acute stroke is demonstrated. There is atrophy and small vessel disease. Flow voids are maintained throughout. Electronically Signed   By: Elsie Stain M.D.   On: 01/05/2017 11:38   US Carotid Bilateral  Result Date: 01/06/2017 CLINICAL DATA:  84 year old male with stroke-like symptoms EXAM: BILATERAL CAROTID DUPLEX ULTRASOUND TECHNIQUE: Wallace Cullens scale imaging, color Doppler and duplex ultrasound were performed of bilateral carotid and vertebral arteries in the neck. COMPARISON:  Brain MRI 01/06/2016 FINDINGS: Criteria: Quantification of carotid stenosis is based on velocity parameters that correlate the residual internal carotid diameter with NASCET-based stenosis levels, using the diameter of the distal internal carotid lumen as the  denominator for stenosis measurement. The following velocity measurements were obtained: RIGHT ICA:  71/10 cm/sec CCA:  80/3 cm/sec SYSTOLIC ICA/CCA RATIO:  0.8 DIASTOLIC ICA/CCA RATIO:  3.3 ECA:  54 cm/sec LEFT ICA:  68/10 cm/sec CCA:  122/4 cm/sec SYSTOLIC ICA/CCA RATIO:  0.6 DIASTOLIC ICA/CCA RATIO:  2.5 ECA:  73 cm/sec RIGHT CAROTID ARTERY: The all the mild heterogeneous atherosclerotic plaque at the carotid bifurcation and in the proximal internal carotid artery. By peak systolic velocity criteria, the estimated stenosis remains less than 50%. RIGHT VERTEBRAL ARTERY:  Patent with normal antegrade flow. LEFT CAROTID ARTERY: Trace heterogeneous atherosclerotic plaque in the proximal internal carotid artery. By peak systolic velocity criteria, the estimated stenosis remains less than 50%. LEFT VERTEBRAL ARTERY:  Patent with normal antegrade flow. IMPRESSION: 1. Mild (1-49%) stenosis  proximal right internal carotid artery secondary to heterogenous atherosclerotic plaque. 2. Mild (1-49%) stenosis proximal left internal carotid artery secondary to heterogenous atherosclerotic plaque. 3. The vertebral arteries are patent with normal antegrade flow. Signed, Sterling Big, MD [facility] Electronically Signed   By: Malachy Moan M.D.   On: 01/06/2017 08:49   Dg Chest Port 1 View  Addendum Date: 01/04/2017   ADDENDUM REPORT: 01/04/2017 11:07 ADDENDUM: These results were called by telephone at the time of interpretation on 01/04/2017 at 10:52 am to Dr. Jene Every , who verbally acknowledged these results. Electronically Signed   By: Ted Mcalpine M.D.   On: 01/04/2017 11:07   Result Date: 01/04/2017 CLINICAL DATA:  Altered mental status. History emphysema, COPD and AFib. EXAM: PORTABLE CHEST 1 VIEW COMPARISON:  01/06/2015 FINDINGS: Cardiomediastinal silhouette is normal. Mediastinal contours appear intact. Tortuosity and calcific atherosclerotic disease of the aorta. There is no evidence of pneumothorax. Advanced upper lobe predominant emphysematous changes and mild chronic interstitial lung disease. There are linear and peribronchial interstitial and airspace opacities in the right upper lobe extending to the pleural surfaces. Possible few mm pulmonary nodules are seen in the right mid lung field. Osseous structures are without acute abnormality. Soft tissues are grossly normal. IMPRESSION: Linear peribronchial airspace opacities in the right upper lobe extending to the pleural surfaces, with associated pleural thickening, on the background of advanced emphysematous changes and mild interstitial lung disease. These findings may represent infectious airspace consolidation, bronchogenic carcinoma, or granulomatous disease with pulmonary tuberculosis being a consideration. Please correlate clinically. Electronically Signed:  By: Ted Mcalpine M.D. On: 01/04/2017 10:48      Management plans discussed with the patient, family and they are in agreement.  CODE STATUS:     Code Status Orders  (From admission, onward)        Start     Ordered   01/04/17 1433  Do not attempt resuscitation (DNR)  Continuous    Question Answer Comment  In the event of cardiac or respiratory ARREST Do not call a "code blue"   In the event of cardiac or respiratory ARREST Do not perform Intubation, CPR, defibrillation or ACLS   In the event of cardiac or respiratory ARREST Use medication by any route, position, wound care, and other measures to relive pain and suffering. May use oxygen, suction and manual treatment of airway obstruction as needed for comfort.      01/04/17 1433    Code Status History    Date Active Date Inactive Code Status Order ID Comments User Context   01/06/2015 22:09 01/08/2015 17:09 Full Code [id number]  Enid Baas, MD Inpatient  Advance Directive Documentation     Most Recent Value  Type of Advance Directive  Out of facility DNR (pink MOST or yellow form)  Pre-existing out of facility DNR order (yellow form or pink MOST form)  Yellow form placed in chart (order not valid for inpatient use)  "MOST" Form in Place?  No data      TOTAL TIME TAKING CARE OF THIS PATIENT: 35 minutes.    Alford Highland M.D on 01/06/2017 at 9:12 AM  Between 7am to 6pm - Pager - [phone]  After 6pm go to [email or web address] - Social research officer, government  [facility] Office  [phone]  CC: Primary care physician; Doctor at facility

## 2017-01-07 ENCOUNTER — Emergency Department: Payer: Medicare Other

## 2017-01-07 ENCOUNTER — Emergency Department
Admission: EM | Admit: 2017-01-07 | Discharge: 2017-01-07 | Disposition: A | Discharge status: Home / Self Care | Payer: Medicare Other | Attending: Emergency Medicine | Admitting: Emergency Medicine

## 2017-01-07 ENCOUNTER — Other Ambulatory Visit: Payer: Self-pay

## 2017-01-07 DIAGNOSIS — Y9389 Activity, other specified: Secondary | ICD-10-CM | POA: Insufficient documentation

## 2017-01-07 DIAGNOSIS — W19XXXA Unspecified fall, initial encounter: Secondary | ICD-10-CM

## 2017-01-07 DIAGNOSIS — W0110XA Fall on same level from slipping, tripping and stumbling with subsequent striking against unspecified object, initial encounter: Secondary | ICD-10-CM | POA: Insufficient documentation

## 2017-01-07 DIAGNOSIS — J449 Chronic obstructive pulmonary disease, unspecified: Secondary | ICD-10-CM | POA: Diagnosis not present

## 2017-01-07 DIAGNOSIS — Y998 Other external cause status: Secondary | ICD-10-CM | POA: Diagnosis not present

## 2017-01-07 DIAGNOSIS — S0081XA Abrasion of other part of head, initial encounter: Secondary | ICD-10-CM | POA: Insufficient documentation

## 2017-01-07 DIAGNOSIS — S0990XA Unspecified injury of head, initial encounter: Secondary | ICD-10-CM

## 2017-01-07 DIAGNOSIS — Z87891 Personal history of nicotine dependence: Secondary | ICD-10-CM | POA: Diagnosis not present

## 2017-01-07 DIAGNOSIS — Y929 Unspecified place or not applicable: Secondary | ICD-10-CM | POA: Insufficient documentation

## 2017-01-07 DIAGNOSIS — F0390 Unspecified dementia without behavioral disturbance: Secondary | ICD-10-CM | POA: Insufficient documentation

## 2017-01-07 LAB — URINALYSIS, COMPLETE (UACMP) WITH MICROSCOPIC
Bacteria, UA: NONE SEEN
Bilirubin Urine: NEGATIVE
GLUCOSE, UA: NEGATIVE mg/dL
HGB URINE DIPSTICK: NEGATIVE
KETONES UR: NEGATIVE mg/dL
LEUKOCYTES UA: NEGATIVE
NITRITE: NEGATIVE
PH: 5 (ref 5.0–8.0)
PROTEIN: 30 mg/dL — AB
RBC / HPF: NONE SEEN RBC/hpf (ref 0–5)
Specific Gravity, Urine: 1.024 (ref 1.005–1.030)

## 2017-01-07 LAB — CBC WITH DIFFERENTIAL/PLATELET
BASOS ABS: 0 10*3/uL (ref 0–0.1)
Basophils Relative: 0 %
EOS PCT: 1 %
Eosinophils Absolute: 0.1 10*3/uL (ref 0–0.7)
HEMATOCRIT: 38.7 % — AB (ref 40.0–52.0)
Hemoglobin: 13.5 g/dL (ref 13.0–18.0)
LYMPHS PCT: 16 %
Lymphs Abs: 1.5 10*3/uL (ref 1.0–3.6)
MCH: 34.8 pg — ABNORMAL HIGH (ref 26.0–34.0)
MCHC: 34.8 g/dL (ref 32.0–36.0)
MCV: 99.8 fL (ref 80.0–100.0)
MONO ABS: 1.1 10*3/uL — AB (ref 0.2–1.0)
Monocytes Relative: 12 %
Neutro Abs: 6.5 10*3/uL (ref 1.4–6.5)
Neutrophils Relative %: 71 %
PLATELETS: 183 10*3/uL (ref 150–440)
RBC: 3.88 MIL/uL — ABNORMAL LOW (ref 4.40–5.90)
RDW: 20.3 % — ABNORMAL HIGH (ref 11.5–14.5)
WBC: 9.1 10*3/uL (ref 3.8–10.6)

## 2017-01-07 LAB — COMPREHENSIVE METABOLIC PANEL
ALT: 8 U/L — ABNORMAL LOW (ref 17–63)
AST: 25 U/L (ref 15–41)
Albumin: 4 g/dL (ref 3.5–5.0)
Alkaline Phosphatase: 61 U/L (ref 38–126)
Anion gap: 8 (ref 5–15)
BILIRUBIN TOTAL: 1.1 mg/dL (ref 0.3–1.2)
BUN: 30 mg/dL — ABNORMAL HIGH (ref 6–20)
CHLORIDE: 105 mmol/L (ref 101–111)
CO2: 26 mmol/L (ref 22–32)
Calcium: 9.1 mg/dL (ref 8.9–10.3)
Creatinine, Ser: 0.95 mg/dL (ref 0.61–1.24)
GFR calc Af Amer: >60 mL/min (ref >60)
Glucose, Bld: 94 mg/dL (ref 65–99)
POTASSIUM: 3.9 mmol/L (ref 3.5–5.1)
Sodium: 139 mmol/L (ref 135–145)
TOTAL PROTEIN: 7.4 g/dL (ref 6.5–8.1)

## 2017-01-07 LAB — CULTURE, BLOOD (ROUTINE X 2): SPECIAL REQUESTS: ADEQUATE

## 2017-01-07 LAB — TROPONIN I: Troponin I: <0.03 ng/mL (ref <0.03)

## 2017-01-07 MED ORDER — SODIUM CHLORIDE 0.9 % IV BOLUS (SEPSIS)
500.0000 mL | Freq: Once | INTRAVENOUS | Status: AC
  Administered 2017-01-07: 500 mL via INTRAVENOUS

## 2017-01-07 NOTE — ED Triage Notes (Addendum)
Pt arrives to ED via [facility] from [facility] s/p fall. Pt reports getting up from bed to chair and tripping over something on the floor while using his walker. Pt denies any c/o dizziness, lightheadedness, or weakness prior to falling. Pt has an abrasion to the RIGHT side of his forehead and a small skin tear to the RIGHT elbow. Pt is A&O, in NAD; RR even, regular, and unlabored. Pt is extremely drowsy, but states he "didn't get much sleep yesterday and is just tired".

## 2017-01-07 NOTE — ED Provider Notes (Signed)
[facility] Emergency Department Provider Note       Time seen: ----------------------------------------- 7:43 AM on 01/07/2017 -----------------------------------------   I have reviewed the triage vital signs and the nursing notes.  HISTORY   Chief Complaint Fall and Head Injury    HPI Donald Blanchard is a 84 y.o. male with a history of A. fib, COPD, emphysema and sciatica who presents to the ED for a fall.  Patient reports getting up from bed to the chair and tripping over something on the floor while using his walker.  He does have history of dementia, he denies any dizziness or lightheadedness or weakness prior to falling.  He was noted to have abrasion on his right frontal scalp.  Past Medical History:  Diagnosis Date  . A-fib (HCC)   . COPD (chronic obstructive pulmonary disease) (HCC)    not on home o2  . Emphysema of lung (HCC)   . Sciatica     Patient Active Problem List   Diagnosis Date Noted  . Altered mental status 01/04/2017  . Right upper lobe pneumonia (HCC) 01/08/2015  . Emphysema lung (HCC) 01/08/2015  . Leukocytosis 01/08/2015  . Anemia 01/08/2015  . Atrial fibrillation (HCC) 01/08/2015  . Dementia with behavioral disturbance 01/08/2015  . Dysphagia 01/08/2015  . Sepsis (HCC) 01/06/2015    Past Surgical History:  Procedure Laterality Date  . NEPHRECTOMY     Left- secondary to trauma  . right lung lesion removal      Allergies Patient has no known allergies.  Social History Social History   Tobacco Use  . Smoking status: Former Smoker    Types: Cigarettes    Last attempt to quit: 01/02/2004    Years since quitting: 13.0  . Smokeless tobacco: Never Used  . Tobacco comment: Quit 10 years ago  Substance Use Topics  . Alcohol use: No    Alcohol/week: 0.0 oz  . Drug use: No    Review of Systems Constitutional: Negative for fever. Cardiovascular: Negative for chest pain. Respiratory: Negative for shortness of  breath. Gastrointestinal: Negative for abdominal pain, vomiting and diarrhea. Musculoskeletal: Negative for back pain. Skin: Positive for forehead abrasion Neurological: Negative for headaches, focal weakness or numbness.  All systems negative/normal/unremarkable except as stated in the HPI  ____________________________________________   PHYSICAL EXAM:  VITAL SIGNS: ED Triage Vitals  Enc Vitals Group     BP 01/07/17 0517 124/70     Pulse Rate 01/07/17 0517 74     Resp 01/07/17 0517 18     Temp 01/07/17 0517 99 F (37.2 C)     Temp Source 01/07/17 0517 Axillary     SpO2 01/07/17 0517 99 %     Weight 01/07/17 0518 140 lb (63.5 kg)     Height 01/07/17 0518 6\' 2"  (1.88 m)     Head Circumference --      Peak Flow --      Pain Score 01/07/17 0517 4     Pain Loc --      Pain Edu? --      Excl. in GC? --    Constitutional: Alert and mostly oriented.  No obvious distress Eyes: Conjunctivae are normal. Normal extraocular movements. ENT   Head: Normocephalic with right frontal scalp abrasion   Nose: No congestion/rhinnorhea.   Mouth/Throat: Mucous membranes are dry   Neck: No stridor. Cardiovascular: Normal rate, regular rhythm. No murmurs, rubs, or gallops. Respiratory: Normal respiratory effort without tachypnea nor retractions. Breath sounds are clear and  equal bilaterally. No wheezes/rales/rhonchi. Gastrointestinal: Soft and nontender. Normal bowel sounds Musculoskeletal: Nontender with normal range of motion in extremities. No lower extremity tenderness nor edema. Neurologic:  Normal speech and language. No gross focal neurologic deficits are appreciated.  Skin:  Skin is warm, dry and intact. No rash noted. Psychiatric: Mood and affect are normal. Speech and behavior are normal.  ____________________________________________  ED COURSE:  As part of my medical decision making, I reviewed the following data within the electronic MEDICAL RECORD NUMBER History obtained  from family if available, nursing notes, old chart and ekg, as well as notes from prior ED visits. Patient presented for for a fall of uncertain etiology, we will assess with labs and imaging as indicated at this time.   Procedures   EKG: Interpreted by me, sinus rhythm the rate of 59 bpm, short PR interval, possible septal infarct age-indeterminate, right bundle branch block ____________________________________________   LABS (pertinent positives/negatives)  Labs Reviewed  CBC WITH DIFFERENTIAL/PLATELET - Abnormal; Notable for the following components:      Result Value   RBC 3.88 (*)    HCT 38.7 (*)    MCH 34.8 (*)    RDW 20.3 (*)    Monocytes Absolute 1.1 (*)    All other components within normal limits  COMPREHENSIVE METABOLIC PANEL - Abnormal; Notable for the following components:   BUN 30 (*)    ALT 8 (*)    All other components within normal limits  URINALYSIS, COMPLETE (UACMP) WITH MICROSCOPIC - Abnormal; Notable for the following components:   Color, Urine YELLOW (*)    APPearance CLEAR (*)    Protein, ur 30 (*)    Squamous Epithelial / LPF 0-5 (*)    All other components within normal limits  TROPONIN I    RADIOLOGY Images were viewed by me CT head IMPRESSION: 1.  No acute intracranial abnormality.  No skull fracture. 2. Unchanged atrophy and chronic small vessel ischemia.   ____________________________________________  DIFFERENTIAL DIAGNOSIS   Contusion, abrasion, subdural hematoma, epidural, skull fracture, dehydration, occult infection, electrolyte abnormality  FINAL ASSESSMENT AND PLAN  Fall, head injury, dementia   Plan: Patient had presented for a fall. Patient's labs are grossly unremarkable. Patient's imaging did not reveal any acute findings.  He was given a 500 cc normal saline bolus.  Otherwise he is stable for outpatient follow-up.   Emily FilbertWilliams, Bekim Werntz E, MD   Note: This note was generated in part or whole with voice recognition  software. Voice recognition is usually quite accurate but there are transcription errors that can and very often do occur. I apologize for any typographical errors that were not detected and corrected.     Emily FilbertWilliams, Ronalee Scheunemann E, MD 01/07/17 1104

## 2017-01-07 NOTE — ED Notes (Signed)
[facility] notified to send for transportation back to facility.

## 2017-01-09 LAB — CULTURE, BLOOD (ROUTINE X 2)
Culture: NO GROWTH
SPECIAL REQUESTS: ADEQUATE

## 2017-03-24 ENCOUNTER — Emergency Department
Admission: EM | Admit: 2017-03-24 | Discharge: 2017-03-24 | Disposition: A | Discharge status: Home / Self Care | Payer: Medicare Other | Attending: Emergency Medicine | Admitting: Emergency Medicine

## 2017-03-24 ENCOUNTER — Emergency Department: Payer: Medicare Other

## 2017-03-24 ENCOUNTER — Other Ambulatory Visit: Payer: Self-pay

## 2017-03-24 ENCOUNTER — Encounter: Payer: Self-pay | Admitting: Emergency Medicine

## 2017-03-24 DIAGNOSIS — S0990XA Unspecified injury of head, initial encounter: Secondary | ICD-10-CM | POA: Diagnosis not present

## 2017-03-24 DIAGNOSIS — Y998 Other external cause status: Secondary | ICD-10-CM | POA: Insufficient documentation

## 2017-03-24 DIAGNOSIS — Z87891 Personal history of nicotine dependence: Secondary | ICD-10-CM | POA: Diagnosis not present

## 2017-03-24 DIAGNOSIS — T148XXA Other injury of unspecified body region, initial encounter: Secondary | ICD-10-CM

## 2017-03-24 DIAGNOSIS — Y92122 Bedroom in nursing home as the place of occurrence of the external cause: Secondary | ICD-10-CM | POA: Insufficient documentation

## 2017-03-24 DIAGNOSIS — Y9389 Activity, other specified: Secondary | ICD-10-CM | POA: Insufficient documentation

## 2017-03-24 DIAGNOSIS — Z23 Encounter for immunization: Secondary | ICD-10-CM | POA: Diagnosis not present

## 2017-03-24 DIAGNOSIS — M25552 Pain in left hip: Secondary | ICD-10-CM | POA: Insufficient documentation

## 2017-03-24 DIAGNOSIS — J449 Chronic obstructive pulmonary disease, unspecified: Secondary | ICD-10-CM | POA: Insufficient documentation

## 2017-03-24 DIAGNOSIS — F0390 Unspecified dementia without behavioral disturbance: Secondary | ICD-10-CM | POA: Diagnosis not present

## 2017-03-24 DIAGNOSIS — Z79899 Other long term (current) drug therapy: Secondary | ICD-10-CM | POA: Diagnosis not present

## 2017-03-24 DIAGNOSIS — W06XXXA Fall from bed, initial encounter: Secondary | ICD-10-CM | POA: Insufficient documentation

## 2017-03-24 DIAGNOSIS — S0001XA Abrasion of scalp, initial encounter: Secondary | ICD-10-CM | POA: Insufficient documentation

## 2017-03-24 LAB — CBC WITH DIFFERENTIAL/PLATELET
BASOS ABS: 0 10*3/uL (ref 0–0.1)
BASOS PCT: 0 %
EOS PCT: 1 %
Eosinophils Absolute: 0.1 10*3/uL (ref 0–0.7)
HEMATOCRIT: 39.3 % — AB (ref 40.0–52.0)
Hemoglobin: 12.9 g/dL — ABNORMAL LOW (ref 13.0–18.0)
Lymphocytes Relative: 13 %
Lymphs Abs: 0.9 10*3/uL — ABNORMAL LOW (ref 1.0–3.6)
MCH: 32.2 pg (ref 26.0–34.0)
MCHC: 32.9 g/dL (ref 32.0–36.0)
MCV: 97.8 fL (ref 80.0–100.0)
MONO ABS: 1.2 10*3/uL — AB (ref 0.2–1.0)
MONOS PCT: 17 %
Neutro Abs: 4.9 10*3/uL (ref 1.4–6.5)
Neutrophils Relative %: 69 %
PLATELETS: 227 10*3/uL (ref 150–440)
RBC: 4.01 MIL/uL — ABNORMAL LOW (ref 4.40–5.90)
RDW: 19.8 % — AB (ref 11.5–14.5)
WBC: 7.1 10*3/uL (ref 3.8–10.6)

## 2017-03-24 LAB — URINALYSIS, COMPLETE (UACMP) WITH MICROSCOPIC
BILIRUBIN URINE: NEGATIVE
Bacteria, UA: NONE SEEN
GLUCOSE, UA: NEGATIVE mg/dL
Hgb urine dipstick: NEGATIVE
KETONES UR: NEGATIVE mg/dL
LEUKOCYTES UA: NEGATIVE
Nitrite: NEGATIVE
PH: 6 (ref 5.0–8.0)
PROTEIN: 30 mg/dL — AB
Specific Gravity, Urine: 1.018 (ref 1.005–1.030)
Squamous Epithelial / LPF: NONE SEEN

## 2017-03-24 LAB — BASIC METABOLIC PANEL
Anion gap: 9 (ref 5–15)
BUN: 18 mg/dL (ref 6–20)
CALCIUM: 9.2 mg/dL (ref 8.9–10.3)
CO2: 27 mmol/L (ref 22–32)
CREATININE: 0.85 mg/dL (ref 0.61–1.24)
Chloride: 101 mmol/L (ref 101–111)
GFR calc Af Amer: >60 mL/min (ref >60)
GLUCOSE: 90 mg/dL (ref 65–99)
Potassium: 4 mmol/L (ref 3.5–5.1)
Sodium: 137 mmol/L (ref 135–145)

## 2017-03-24 LAB — TROPONIN I: Troponin I: <0.03 ng/mL (ref <0.03)

## 2017-03-24 MED ORDER — BACITRACIN ZINC 500 UNIT/GM EX OINT
TOPICAL_OINTMENT | CUTANEOUS | Status: AC
  Administered 2017-03-24: 14:00:00
  Filled 2017-03-24: qty 0.9

## 2017-03-24 MED ORDER — TETANUS-DIPHTH-ACELL PERTUSSIS 5-2.5-18.5 LF-MCG/0.5 IM SUSP
0.5000 mL | Freq: Once | INTRAMUSCULAR | Status: AC
  Administered 2017-03-24: 0.5 mL via INTRAMUSCULAR
  Filled 2017-03-24: qty 0.5

## 2017-03-24 NOTE — ED Notes (Signed)
Spoke with patient's daughter who states she is unsure of patient's tetanus status. Gave verbal consent for updated tetanus booster and discharge back to facility.

## 2017-03-24 NOTE — ED Notes (Signed)
HIPAA compliant message left on daughter's cell phone number. Will call back.

## 2017-03-24 NOTE — ED Provider Notes (Signed)
[facility] Emergency Department Provider Note ____________________________________________   I have reviewed the triage vital signs and the triage nursing note.  HISTORY  Chief Complaint Fall   Historian Level 5 Caveat History Limited by Poor historian  HPI Donald Blanchard is a 84 y.o. male from nursing home, unwitnessed fall, apparently mattress had set off the bed.  Patient is poor historian, but he stating that he is not having any pain now.  He does have an abrasion to the top of his scalp.  Apparently at one point he was complaining of left hip pain, now he is not complaining of left hip pain or chest pain or back pain.  Report of recent altered mental status.   Past Medical History:  Diagnosis Date  . A-fib (HCC)   . COPD (chronic obstructive pulmonary disease) (HCC)    not on home o2  . Emphysema of lung (HCC)   . Sciatica     Patient Active Problem List   Diagnosis Date Noted  . Altered mental status 01/04/2017  . Right upper lobe pneumonia (HCC) 01/08/2015  . Emphysema lung (HCC) 01/08/2015  . Leukocytosis 01/08/2015  . Anemia 01/08/2015  . Atrial fibrillation (HCC) 01/08/2015  . Dementia with behavioral disturbance 01/08/2015  . Dysphagia 01/08/2015  . Sepsis (HCC) 01/06/2015    Past Surgical History:  Procedure Laterality Date  . NEPHRECTOMY     Left- secondary to trauma  . right lung lesion removal      Prior to Admission medications   Medication Sig Start Date End Date Taking? Authorizing Provider  acetaminophen (TYLENOL) 325 MG tablet Take 650 mg by mouth every 6 (six) hours as needed.   Yes [provider]  lidocaine (LIDODERM) 5 % Place 1 patch onto the skin at bedtime. Remove & Discard patch within 12 hours or as directed by MD   Yes [provider]  mirtazapine (REMERON) 7.5 MG tablet Take 7.5 mg by mouth at bedtime.   Yes [provider]  Multiple Vitamin (MULTIVITAMIN WITH MINERALS) TABS  tablet Take 1 tablet by mouth daily.   Yes [provider]  omeprazole (PRILOSEC) 20 MG capsule Take 20 mg by mouth daily.   Yes [provider]  tiotropium (SPIRIVA) 18 MCG inhalation capsule Place 18 mcg into inhaler and inhale daily.   Yes [provider]  traMADol (ULTRAM) 50 MG tablet Take 50 mg by mouth 2 (two) times daily.   Yes [provider]  ipratropium-albuterol (DUONEB) 0.5-2.5 (3) MG/3ML SOLN Take 3 mLs by nebulization every 6 (six) hours as needed. Patient not taking: Reported on 01/04/2017 01/08/15   Katharina Caper, MD  levofloxacin (LEVAQUIN) 500 MG tablet Take 1 tablet (500 mg total) by mouth daily. Patient not taking: Reported on 01/07/2017 01/06/17   Alford Highland, MD  QUEtiapine (SEROQUEL) 25 MG tablet Take 0.5 tablets (12.5 mg total) by mouth at bedtime. Patient not taking: Reported on 01/07/2017 01/06/17   Alford Highland, MD    No Known Allergies  Family History  Problem Relation Age of Onset  . Hypertension Father     Social History Social History   Tobacco Use  . Smoking status: Former Smoker    Types: Cigarettes    Last attempt to quit: 01/02/2004    Years since quitting: 13.2  . Smokeless tobacco: Never Used  . Tobacco comment: Quit 10 years ago  Substance Use Topics  . Alcohol use: No    Alcohol/week: 0.0 oz  . Drug use:  No    Review of Systems  Patient denies any headache, abdominal pain, or as per HPI.  ____________________________________________   PHYSICAL EXAM:  VITAL SIGNS: ED Triage Vitals  Enc Vitals Group     BP 03/24/17 0941 128/69     Pulse Rate 03/24/17 0941 60     Resp 03/24/17 0941 18     Temp 03/24/17 0941 98.2 F (36.8 C)     Temp Source 03/24/17 0941 Oral     SpO2 03/24/17 0938 98 %     Weight 03/24/17 0941 140 lb (63.5 kg)     Height 03/24/17 0941 5\' 10"  (1.778 m)     Head Circumference --      Peak Flow --      Pain Score --      Pain Loc --      Pain Edu? --      Excl. in GC? --       Constitutional: Alert and cooperative, poor historian. Well appearing and in no distress. HEENT   Head: Normocephalic.  Abrasion top of scalp.      Eyes: Conjunctivae are normal. Pupils equal and round.       Ears:         Nose: No congestion/rhinnorhea.   Mouth/Throat: Mucous membranes are moist.   Neck: No stridor.  Nontender cervical spine, no step offs. Cardiovascular/Chest: Normal rate, regular rhythm.  No murmurs, rubs, or gallops. Respiratory: Normal respiratory effort without tachypnea nor retractions. Breath sounds are clear and equal bilaterally. No wheezes/rales/rhonchi. Gastrointestinal: Soft. No distention, no guarding, no rebound. Nontender.    Genitourinary/rectal:Deferred Musculoskeletal: Pelvis stable, he says "oh" when I move his legs, but states no pain when he moves both legs himself.  No obvious evidence of trauma to the extremities. Neurologic:  Normal speech and language. No gross or focal neurologic deficits are appreciated. Skin:  Skin is warm, dry and intact. No rash noted. Psychiatric: No agitation.   ____________________________________________  LABS (pertinent positives/negatives) I, Governor Rooks, MD the attending physician have reviewed the labs noted below.  Labs Reviewed  URINALYSIS, COMPLETE (UACMP) WITH MICROSCOPIC - Abnormal; Notable for the following components:      Result Value   Color, Urine AMBER (*)    APPearance CLEAR (*)    Protein, ur 30 (*)    All other components within normal limits  CBC WITH DIFFERENTIAL/PLATELET - Abnormal; Notable for the following components:   RBC 4.01 (*)    Hemoglobin 12.9 (*)    HCT 39.3 (*)    RDW 19.8 (*)    Lymphs Abs 0.9 (*)    Monocytes Absolute 1.2 (*)    All other components within normal limits  BASIC METABOLIC PANEL  TROPONIN I    ____________________________________________    EKG I, Governor Rooks, MD, the attending physician have personally viewed and interpreted all  ECGs.  67 bpm.  Nonspecific intraventricular conduction delay.  Occasional PVCs.  Normal sinus rhythm.  Nonspecific T wave. ____________________________________________  RADIOLOGY   Left hip with pelvis: Radiologist interpretation:  IMPRESSION: No fracture identified.  CT head and cervical spine without contrast radiologist interpretation: IMPRESSION: Atrophy with small vessel chronic ischemic changes of deep cerebral white matter.  No acute intracranial abnormalities.  Degenerative disc and facet disease changes cervical spine.  No acute cervical spine abnormalities.  Biapical lung scarring and emphysematous changes. __________________________________________  PROCEDURES  Procedure(s) performed: None  Procedures  Critical Care performed: None   ____________________________________________  ED COURSE / ASSESSMENT  AND PLAN  Pertinent labs & imaging results that were available during my care of the patient were reviewed by me and considered in my medical decision making (see chart for details).   Patient arrived after unwitnessed fall, but a poor historian himself, but does not appear to have any additional chest abdomen back or extremity injuries.  He does have an abrasion to his scalp.  Head and C-spine CTs were obtained and reviewed, negative for intracranial or acute traumatic injury.  X-ray was obtained given patient had complained apparently worsened initially about left hip pain, this including pelvis showed no sign of traumatic injuries.  Screening evaluation in terms of medical issues that have contributed to an unwitnessed fall, laboratory studies and urinalysis and EKG are without findings that are certain for any acute medical emergency at this point.  We will plan for discharge back to nursing home.    Patient / Family / Caregiver informed of clinical course, medical decision-making process, and agree with plan.   I discussed return precautions,  follow-up instructions, and discharge instructions with patient and/or family.  Discharge Instructions : Your evaluated after a fall, no serious injury is suspected.  Your exam and evaluation are overall reassuring in the emergency room today.  Return to emergency department immediately for any worsening condition including confusion or altered mental status, weakness, numbness, trouble breathing, or any other symptoms concerning to you.    ___________________________________________   FINAL CLINICAL IMPRESSION(S) / ED DIAGNOSES   Final diagnoses:  Abrasion  Minor head injury, initial encounter      ___________________________________________        Note: This dictation was prepared with Dragon dictation. Any transcriptional errors that result from this process are unintentional    Governor RooksLord, Bexleigh Theriault, MD 03/24/17 1426

## 2017-03-24 NOTE — ED Notes (Signed)
Patient transported to CT and XR 

## 2017-03-24 NOTE — ED Notes (Signed)
Patient assisted to use urinal. Patient dressed in pants and shoes. Cell phone in pocket. Patient assisted to wheelchair and transported to lobby for pick up by facility. Verbal consent for discharge given by Berton Bonynthia Tate, patient's daughter.

## 2017-03-24 NOTE — Discharge Instructions (Signed)
Your evaluated after a fall, no serious injury is suspected.  Your exam and evaluation are overall reassuring in the emergency room today.  Return to emergency department immediately for any worsening condition including confusion or altered mental status, weakness, numbness, trouble breathing, or any other symptoms concerning to you.

## 2017-03-24 NOTE — ED Notes (Signed)
Patient's abrasion on top of head cleaned with saline. Bacitracin and non-stick dressing applied.

## 2017-03-24 NOTE — ED Notes (Signed)
Per patient's daughter, [facility] was [facility] before [facility] was sent to ED. States she will get in contact with management at facility to have them arrange transportation for patient back to [facility].

## 2017-03-24 NOTE — ED Notes (Signed)
Patient assisted to use urinal. No further complaints at this time.

## 2017-03-24 NOTE — ED Notes (Signed)
Two unsuccessful attempts to contact patient's daughter. Will continue to try contacting her.

## 2017-03-24 NOTE — ED Notes (Signed)
Returned from XR and CT

## 2017-03-24 NOTE — ED Triage Notes (Signed)
Pt arrived via [facility] from [facility], with reports of unwitnessed fall, when pt was found in room, the mattress pad had slid off and pt was on the floor.  Pt was c/o left hip pain and has abrasion to the top of the head.  Per EMS staff reports pt was a at baseline after the fall. Pt has hx of dementia and low back pain. Pt has Lidoderm patch and takes tramadol for pain.  Pt sleepy at this time, but does awaken.

## 2017-09-30 ENCOUNTER — Emergency Department: Payer: Medicare Other

## 2017-09-30 ENCOUNTER — Inpatient Hospital Stay
Admission: EM | Admit: 2017-09-30 | Discharge: 2017-10-04 | DRG: 871 | Disposition: A | Discharge status: Home Health | Payer: Medicare Other | Attending: Internal Medicine | Admitting: Internal Medicine

## 2017-09-30 ENCOUNTER — Other Ambulatory Visit: Payer: Self-pay

## 2017-09-30 ENCOUNTER — Encounter: Payer: Self-pay | Admitting: Emergency Medicine

## 2017-09-30 DIAGNOSIS — F329 Major depressive disorder, single episode, unspecified: Secondary | ICD-10-CM | POA: Diagnosis present

## 2017-09-30 DIAGNOSIS — R509 Fever, unspecified: Secondary | ICD-10-CM | POA: Diagnosis not present

## 2017-09-30 DIAGNOSIS — I480 Paroxysmal atrial fibrillation: Secondary | ICD-10-CM | POA: Diagnosis present

## 2017-09-30 DIAGNOSIS — J189 Pneumonia, unspecified organism: Secondary | ICD-10-CM | POA: Diagnosis not present

## 2017-09-30 DIAGNOSIS — Z8249 Family history of ischemic heart disease and other diseases of the circulatory system: Secondary | ICD-10-CM | POA: Diagnosis not present

## 2017-09-30 DIAGNOSIS — J439 Emphysema, unspecified: Secondary | ICD-10-CM | POA: Diagnosis present

## 2017-09-30 DIAGNOSIS — E43 Unspecified severe protein-calorie malnutrition: Secondary | ICD-10-CM | POA: Diagnosis present

## 2017-09-30 DIAGNOSIS — F0280 Dementia in other diseases classified elsewhere without behavioral disturbance: Secondary | ICD-10-CM | POA: Diagnosis present

## 2017-09-30 DIAGNOSIS — L89151 Pressure ulcer of sacral region, stage 1: Secondary | ICD-10-CM | POA: Diagnosis present

## 2017-09-30 DIAGNOSIS — G309 Alzheimer's disease, unspecified: Secondary | ICD-10-CM | POA: Diagnosis present

## 2017-09-30 DIAGNOSIS — L8990 Pressure ulcer of unspecified site, unspecified stage: Secondary | ICD-10-CM

## 2017-09-30 DIAGNOSIS — A419 Sepsis, unspecified organism: Secondary | ICD-10-CM | POA: Diagnosis present

## 2017-09-30 DIAGNOSIS — Z87891 Personal history of nicotine dependence: Secondary | ICD-10-CM

## 2017-09-30 DIAGNOSIS — Z791 Long term (current) use of non-steroidal anti-inflammatories (NSAID): Secondary | ICD-10-CM | POA: Diagnosis not present

## 2017-09-30 DIAGNOSIS — M5430 Sciatica, unspecified side: Secondary | ICD-10-CM | POA: Diagnosis present

## 2017-09-30 DIAGNOSIS — Z9181 History of falling: Secondary | ICD-10-CM

## 2017-09-30 DIAGNOSIS — Z7189 Other specified counseling: Secondary | ICD-10-CM | POA: Diagnosis not present

## 2017-09-30 DIAGNOSIS — Z79891 Long term (current) use of opiate analgesic: Secondary | ICD-10-CM | POA: Diagnosis not present

## 2017-09-30 DIAGNOSIS — J690 Pneumonitis due to inhalation of food and vomit: Secondary | ICD-10-CM | POA: Diagnosis present

## 2017-09-30 DIAGNOSIS — B379 Candidiasis, unspecified: Secondary | ICD-10-CM | POA: Diagnosis present

## 2017-09-30 DIAGNOSIS — E872 Acidosis: Secondary | ICD-10-CM | POA: Diagnosis present

## 2017-09-30 DIAGNOSIS — I10 Essential (primary) hypertension: Secondary | ICD-10-CM | POA: Diagnosis present

## 2017-09-30 DIAGNOSIS — Z66 Do not resuscitate: Secondary | ICD-10-CM | POA: Diagnosis present

## 2017-09-30 DIAGNOSIS — Z905 Acquired absence of kidney: Secondary | ICD-10-CM

## 2017-09-30 DIAGNOSIS — Z79899 Other long term (current) drug therapy: Secondary | ICD-10-CM

## 2017-09-30 DIAGNOSIS — Z681 Body mass index (BMI) 19 or less, adult: Secondary | ICD-10-CM | POA: Diagnosis not present

## 2017-09-30 DIAGNOSIS — R1310 Dysphagia, unspecified: Secondary | ICD-10-CM | POA: Diagnosis present

## 2017-09-30 DIAGNOSIS — Z515 Encounter for palliative care: Secondary | ICD-10-CM | POA: Diagnosis not present

## 2017-09-30 LAB — CBC WITH DIFFERENTIAL/PLATELET
BASOS ABS: 0.1 10*3/uL (ref 0–0.1)
Basophils Relative: 0 %
EOS ABS: 0 10*3/uL (ref 0–0.7)
EOS PCT: 0 %
HCT: 36 % — ABNORMAL LOW (ref 40.0–52.0)
Hemoglobin: 12.3 g/dL — ABNORMAL LOW (ref 13.0–18.0)
LYMPHS ABS: 0.4 10*3/uL — AB (ref 1.0–3.6)
LYMPHS PCT: 1 %
MCH: 33.3 pg (ref 26.0–34.0)
MCHC: 34 g/dL (ref 32.0–36.0)
MCV: 97.9 fL (ref 80.0–100.0)
MONO ABS: 1.4 10*3/uL — AB (ref 0.2–1.0)
Monocytes Relative: 4 %
NEUTROS ABS: 35.4 10*3/uL — AB (ref 1.4–6.5)
NEUTROS PCT: 95 %
PLATELETS: 280 10*3/uL (ref 150–440)
RBC: 3.68 MIL/uL — ABNORMAL LOW (ref 4.40–5.90)
RDW: 21.3 % — AB (ref 11.5–14.5)
WBC: 37.2 10*3/uL — AB (ref 3.8–10.6)

## 2017-09-30 LAB — URINALYSIS, ROUTINE W REFLEX MICROSCOPIC
Bacteria, UA: NONE SEEN
Bilirubin Urine: NEGATIVE
Glucose, UA: NEGATIVE mg/dL
Hgb urine dipstick: NEGATIVE
Ketones, ur: NEGATIVE mg/dL
Leukocytes, UA: NEGATIVE
Nitrite: NEGATIVE
PH: 6 (ref 5.0–8.0)
Protein, ur: 100 mg/dL — AB
SPECIFIC GRAVITY, URINE: 1.021 (ref 1.005–1.030)

## 2017-09-30 LAB — COMPREHENSIVE METABOLIC PANEL
ALK PHOS: 98 U/L (ref 38–126)
ALT: 11 U/L (ref 0–44)
ANION GAP: 9 (ref 5–15)
AST: 30 U/L (ref 15–41)
Albumin: 3.8 g/dL (ref 3.5–5.0)
BUN: 24 mg/dL — ABNORMAL HIGH (ref 8–23)
CALCIUM: 9.3 mg/dL (ref 8.9–10.3)
CO2: 27 mmol/L (ref 22–32)
CREATININE: 0.99 mg/dL (ref 0.61–1.24)
Chloride: 100 mmol/L (ref 98–111)
Glucose, Bld: 133 mg/dL — ABNORMAL HIGH (ref 70–99)
Potassium: 4.2 mmol/L (ref 3.5–5.1)
Sodium: 136 mmol/L (ref 135–145)
Total Bilirubin: 1.6 mg/dL — ABNORMAL HIGH (ref 0.3–1.2)
Total Protein: 8.2 g/dL — ABNORMAL HIGH (ref 6.5–8.1)

## 2017-09-30 LAB — MRSA PCR SCREENING: MRSA by PCR: NEGATIVE

## 2017-09-30 LAB — LACTIC ACID, PLASMA
LACTIC ACID, VENOUS: 2 mmol/L — AB (ref 0.5–1.9)
Lactic Acid, Venous: 1.5 mmol/L (ref 0.5–1.9)

## 2017-09-30 MED ORDER — VANCOMYCIN HCL IN DEXTROSE 1-5 GM/200ML-% IV SOLN
1000.0000 mg | INTRAVENOUS | Status: DC
  Administered 2017-10-01: 02:00:00 1000 mg via INTRAVENOUS
  Filled 2017-09-30 (×2): qty 200

## 2017-09-30 MED ORDER — HEPARIN SODIUM (PORCINE) 5000 UNIT/ML IJ SOLN
5000.0000 [IU] | Freq: Three times a day (TID) | INTRAMUSCULAR | Status: DC
  Administered 2017-09-30 – 2017-10-04 (×10): 5000 [IU] via SUBCUTANEOUS
  Filled 2017-09-30 (×11): qty 1

## 2017-09-30 MED ORDER — SODIUM CHLORIDE 0.9 % IV BOLUS
1000.0000 mL | Freq: Once | INTRAVENOUS | Status: AC
  Administered 2017-09-30: 1000 mL via INTRAVENOUS

## 2017-09-30 MED ORDER — BISACODYL 5 MG PO TBEC: 5.0000 mg | DELAYED_RELEASE_TABLET | Freq: Every day | ORAL | Status: DC | PRN

## 2017-09-30 MED ORDER — ACETAMINOPHEN 325 MG PO TABS: 650.0000 mg | ORAL_TABLET | Freq: Four times a day (QID) | ORAL | Status: DC | PRN

## 2017-09-30 MED ORDER — VANCOMYCIN HCL IN DEXTROSE 1-5 GM/200ML-% IV SOLN
1000.0000 mg | Freq: Once | INTRAVENOUS | Status: AC
  Administered 2017-09-30: 1000 mg via INTRAVENOUS
  Filled 2017-09-30: qty 200

## 2017-09-30 MED ORDER — SODIUM CHLORIDE 0.9 % IV SOLN
1.0000 g | Freq: Once | INTRAVENOUS | Status: AC
  Administered 2017-09-30: 1 g via INTRAVENOUS
  Filled 2017-09-30: qty 1

## 2017-09-30 MED ORDER — ACETAMINOPHEN 650 MG RE SUPP: 650.0000 mg | Freq: Four times a day (QID) | RECTAL | Status: DC | PRN

## 2017-09-30 MED ORDER — PANTOPRAZOLE SODIUM 40 MG PO TBEC
40.0000 mg | DELAYED_RELEASE_TABLET | Freq: Every day | ORAL | Status: DC
  Administered 2017-10-01 – 2017-10-04 (×4): 40 mg via ORAL
  Filled 2017-09-30 (×4): qty 1

## 2017-09-30 MED ORDER — SODIUM CHLORIDE 0.9 % IV SOLN
INTRAVENOUS | Status: DC
  Administered 2017-09-30 – 2017-10-01 (×2): via INTRAVENOUS

## 2017-09-30 MED ORDER — TRAZODONE HCL 50 MG PO TABS
25.0000 mg | ORAL_TABLET | Freq: Every evening | ORAL | Status: DC | PRN
  Administered 2017-10-01 – 2017-10-02 (×2): 25 mg via ORAL
  Filled 2017-09-30 (×3): qty 1

## 2017-09-30 MED ORDER — IPRATROPIUM-ALBUTEROL 0.5-2.5 (3) MG/3ML IN SOLN
3.0000 mL | Freq: Four times a day (QID) | RESPIRATORY_TRACT | Status: DC
  Administered 2017-09-30 – 2017-10-04 (×15): 3 mL via RESPIRATORY_TRACT
  Filled 2017-09-30 (×16): qty 3

## 2017-09-30 MED ORDER — ONDANSETRON HCL 4 MG PO TABS: 4.0000 mg | ORAL_TABLET | Freq: Four times a day (QID) | ORAL | Status: DC | PRN

## 2017-09-30 MED ORDER — MIRTAZAPINE 15 MG PO TABS
7.5000 mg | ORAL_TABLET | Freq: Every day | ORAL | Status: DC
  Administered 2017-09-30 – 2017-10-03 (×4): 7.5 mg via ORAL
  Filled 2017-09-30 (×4): qty 1

## 2017-09-30 MED ORDER — HYDROCODONE-ACETAMINOPHEN 5-325 MG PO TABS
1.0000 | ORAL_TABLET | ORAL | Status: DC | PRN
  Administered 2017-10-03: 2 via ORAL
  Filled 2017-09-30: qty 2

## 2017-09-30 MED ORDER — QUETIAPINE FUMARATE 25 MG PO TABS
12.5000 mg | ORAL_TABLET | Freq: Every day | ORAL | Status: DC
  Administered 2017-09-30 – 2017-10-03 (×4): 12.5 mg via ORAL
  Filled 2017-09-30 (×4): qty 1

## 2017-09-30 MED ORDER — ONDANSETRON HCL 4 MG/2ML IJ SOLN: 4.0000 mg | Freq: Four times a day (QID) | INTRAMUSCULAR | Status: DC | PRN

## 2017-09-30 MED ORDER — DOCUSATE SODIUM 100 MG PO CAPS
100.0000 mg | ORAL_CAPSULE | Freq: Two times a day (BID) | ORAL | Status: DC
  Administered 2017-09-30 – 2017-10-04 (×8): 100 mg via ORAL
  Filled 2017-09-30 (×8): qty 1

## 2017-09-30 MED ORDER — PIPERACILLIN-TAZOBACTAM 3.375 G IVPB
3.3750 g | Freq: Three times a day (TID) | INTRAVENOUS | Status: DC
  Administered 2017-09-30 – 2017-10-02 (×5): 3.375 g via INTRAVENOUS
  Filled 2017-09-30 (×5): qty 50

## 2017-09-30 NOTE — ED Triage Notes (Signed)
Patient from [facility] via [facility]. Reports patient has had increased weakness and falls over the past week.  Patient has history of dementia but increased lethargy above baseline. Facility reports intermittent fever.

## 2017-09-30 NOTE — ED Notes (Signed)
Date and time results received: 09/30/17 2:56 PM  Test: Lactic Acid Critical Value: 2.0 mmol/L  Name of Provider Notified: Dr. Darnelle Catalan

## 2017-09-30 NOTE — Progress Notes (Signed)
CODE SEPSIS - PHARMACY COMMUNICATION  **Broad Spectrum Antibiotics should be administered within 1 hour of Sepsis diagnosis**  Time Code Sepsis Called/Page Received: 1408  Antibiotics Ordered: 1410  Time of 1st antibiotic administration: 1434  Additional action taken by pharmacy: n/a  If necessary, Name of Provider/Nurse Contacted: n/a    Orinda Kenner ,PharmD Clinical Pharmacist  09/30/2017  2:14 PM

## 2017-09-30 NOTE — Progress Notes (Signed)
Pharmacy Antibiotic Note  Donald Blanchard is a 84 y.o. male admitted on 09/30/2017 with sepsis.  Pharmacy has been consulted for Zosyn and vancomycin dosing.  Plan: Vancomycin 1000 mg once and then followed by 8 hour stacked dosing with vancomycin 1000 mg IV every 18 hours .  Goal trough 15-20 mcg/mL. Zosyn 3.375g IV q8h (4 hour infusion).  Height: 6\' 1"  (185.4 cm) Weight: 138 lb 14.2 oz (63 kg) IBW/kg (Calculated) : 79.9  Temp (24hrs), Avg:101.8 F (38.8 C), Min:101.8 F (38.8 C), Max:101.8 F (38.8 C)  Recent Labs  Lab 09/30/17 1413 09/30/17 1417  WBC 37.2*  --   CREATININE 0.99  --   LATICACIDVEN  --  2.0*    Estimated Creatinine Clearance: 49.5 mL/min (by C-G formula based on SCr of 0.99 mg/dL).    No Known Allergies  Antimicrobials this admission: Cefepime 9/30 >> 9/30 Zosyn 9/30 >>  Vanco 9/30 >>  Dose adjustments this admission:   Microbiology results: 9/30 BCx:   UCx:    Sputum:   9/30 MRSA PCR:   Thank you for allowing pharmacy to be a part of this patient's care.  Orinda Kenner 09/30/2017 3:33 PM

## 2017-09-30 NOTE — H&P (Signed)
[facility] [place] [facility]   PATIENT NAME: Donald Blanchard    MR#:  [id number]  DATE OF BIRTH:  1930-01-20  DATE OF ADMISSION:  09/30/2017  PRIMARY CARE PHYSICIAN: Patient, No Pcp Per   REQUESTING/REFERRING PHYSICIAN:   CHIEF COMPLAINT: Shortness of breath, fever   Chief Complaint  Patient presents with  . Weakness  . Fall    HISTORY OF PRESENT ILLNESS:  Donald Blanchard  is a 84 y.o. male with a known history of Alzheimer's dementia, depression, atrial fibrillation brought in because of fever, shortness of breath.  Patient has advanced dementia and unable to give any history.  History obtained from the chart and also reviewing old records.  Chest x-ray showed left-sided pneumonia.  White count is elevated up to 37, lactic acid of 2.  Brought in from [facility] because of increased falls, generalized weakness in the past week, patient also noted to have more lethargy, fever, cough.Marland Kitchen  PAST MEDICAL HISTORY:   Past Medical History:  Diagnosis Date  . A-fib (HCC)   . COPD (chronic obstructive pulmonary disease) (HCC)    not on home o2  . Emphysema of lung (HCC)   . Sciatica     PAST SURGICAL HISTOIRY:   Past Surgical History:  Procedure Laterality Date  . NEPHRECTOMY     Left- secondary to trauma  . right lung lesion removal      SOCIAL HISTORY:   Social History   Tobacco Use  . Smoking status: Former Smoker    Types: Cigarettes    Last attempt to quit: 01/02/2004    Years since quitting: 13.7  . Smokeless tobacco: Never Used  . Tobacco comment: Quit 10 years ago  Substance Use Topics  . Alcohol use: No    Alcohol/week: 0.0 standard drinks    FAMILY HISTORY:   Family History  Problem Relation Age of Onset  . Hypertension Father     DRUG ALLERGIES:  No Known Allergies  REVIEW OF SYSTEMS:  Unable to obtain review of systems because of dementia. MEDICATIONS AT HOME:   Prior to Admission medications   Medication  Sig Start Date End Date Taking? Authorizing Provider  acetaminophen (TYLENOL) 325 MG tablet Take 650 mg by mouth every 6 (six) hours as needed for mild pain, moderate pain or fever.    Yes [provider]  guaiFENesin (MUCINEX) 600 MG 12 hr tablet Take 600 mg by mouth 2 (two) times daily. 09/23/17 09/30/17 Yes [provider]  meloxicam (MOBIC) 7.5 MG tablet Take 7.5 mg by mouth daily.   Yes [provider]  mirtazapine (REMERON) 7.5 MG tablet Take 7.5 mg by mouth at bedtime.   Yes [provider]  Multiple Vitamin (MULTIVITAMIN WITH MINERALS) TABS tablet Take 1 tablet by mouth daily.   Yes [provider]  omeprazole (PRILOSEC) 20 MG capsule Take 20 mg by mouth daily.   Yes [provider]  tiotropium (SPIRIVA) 18 MCG inhalation capsule Place 18 mcg into inhaler and inhale daily.   Yes [provider]  traMADol (ULTRAM) 50 MG tablet Take 50 mg by mouth 2 (two) times daily.   Yes [provider]  QUEtiapine (SEROQUEL) 25 MG tablet Take 0.5 tablets (12.5 mg total) by mouth at bedtime. Patient not taking: Reported on 01/07/2017 01/06/17   Alford Highland, MD      VITAL SIGNS:  Blood pressure (!) 131/53, pulse (!) 154, temperature (!) 101.8 F (38.8 C), temperature source Rectal,  resp. rate 18, height 6\' 1"  (1.854 m), weight 63 kg, SpO2 94 %.  PHYSICAL EXAMINATION:  GENERAL:  84 y.o.-year-old patient lying in the bed with no acute distress..  Patient thinks that he is in his daughter's house. EYES: Pupils equal, round, reactive to light and accommodation. No scleral icterus.  HEENT: Head atraumatic, normocephalic. Oropharynx and nasopharynx clear.  NECK:  Supple, no jugular venous distention. No thyroid enlargement, no tenderness.  LUNGS: Diminished breath sounds at bases, no wheeze or rhonchi.  Not using accessory muscles of respiration. CARDIOVASCULAR: S1, S2 normal. No murmurs, rubs, or gallops.  ABDOMEN: Soft, nontender,  nondistended. Bowel sounds present. No organomegaly or mass.  EXTREMITIES: No pedal edema, cyanosis, or clubbing.  NEUROLOGIC: Unable to follow commands due to dementia so neurological exam not possible.  Deficit observed no facial droop, no rigidity of extremities.  PSYCHIATRIC: The patient is alert and oriented x 3.  SKIN: No obvious rash, lesion, or ulcer.   LABORATORY PANEL:   CBC Recent Labs  Lab 09/30/17 1413  WBC 37.2*  HGB 12.3*  HCT 36.0*  PLT 280   ------------------------------------------------------------------------------------------------------------------  Chemistries  Recent Labs  Lab 09/30/17 1413  NA 136  K 4.2  CL 100  CO2 27  GLUCOSE 133*  BUN 24*  CREATININE 0.99  CALCIUM 9.3  AST 30  ALT 11  ALKPHOS 98  BILITOT 1.6*   ------------------------------------------------------------------------------------------------------------------  Cardiac Enzymes No results for input(s): TROPONINI in the last 168 hours. ------------------------------------------------------------------------------------------------------------------  RADIOLOGY:  Dg Chest Portable 1 View  Result Date: 09/30/2017 CLINICAL DATA:  Increased weakness and falls over past weak, history dementia, atrial fibrillation, COPD, emphysema EXAM: PORTABLE CHEST 1 VIEW COMPARISON:  Portable exam 1431 hours compared to 01/04/2017 FINDINGS: Normal heart size, mediastinal contours, and pulmonary vascularity. Progressive scarring and volume loss at both apices greater on RIGHT. Progressive interstitial lung disease changes at LEFT base versus superimposed acute infiltrate. Stable chronic interstitial disease at RIGHT base. Underlying emphysematous changes. No pleural effusion, pneumothorax or acute osseous findings. IMPRESSION: COPD changes with biapical scarring RIGHT greater than LEFT. Increased LEFT basilar infiltrate question progressing of chronic interstitial lung disease versus superimposed  pneumonia. Electronically Signed   By: Ulyses Southward M.D.   On: 09/30/2017 14:49    EKG:   Orders placed or performed during the hospital encounter of 09/30/17  . EKG 12-Lead  . EKG 12-Lead  . ED EKG 12-Lead  . ED EKG 12-Lead    IMPRESSION AND PLAN:   84 year old male patient with history of COPD, advanced dementia comes in because of fever, shortness of breath, more confused than usual. 1.  Sepsis present on admission with evidence of fever, elevated white count,, elevated lactic acid: Sepsis due to pneumonia: Continue IV fluids, IV antibiotics, follow blood cultures, follow white count 2.  History of COPD with bilateral scarring.  Continue bronchodilators. #3 history of depression: Continue Remeron 4.  Alzheimer's dementia, started on dysphagia 1 diet, get speech therapy evaluation  #4 history of chronic A. fib, not on anticoagulation because of his advanced dementia, high risk for falls.  Continue DVT prophylaxis with Lovenox while in the hospital. Disposition patient is from [place], likely discharge back to [facility] when ready. All the records are reviewed and case discussed with ED provider. Management plans discussed with the patient, family and they are in agreement.  CODE STATUS: DNR  TOTAL TIME TAKING CARE OF THIS PATIENT: 55 minutes.    Katha Hamming M.D on 09/30/2017 at 3:31 PM  Between 7am to 6pm - Pager - [phone]  After 6pm go to [email or web address] - password [email or web address]  [facility]  Office  [phone]  CC: Primary care physician; Patient, No Pcp Per  Note: This dictation was prepared with Dragon dictation along with smaller phrase technology. Any transcriptional errors that result from this process are unintentional.

## 2017-09-30 NOTE — ED Provider Notes (Addendum)
[facility] Emergency Department Provider Note   ____________________________________________   None    (approximate)  I have reviewed the triage vital signs and the nursing notes.   HISTORY  Chief Complaint Chief complaint is fever History limited by dementia  HPI Donald Blanchard is a 84 y.o. male who has dementia who normally walks actively and is awake and alert.  He has been having increased weakness and falling this last week is been running a fever.  Here he had a fever of 101.  He is more demented than usual.  Past Medical History:  Diagnosis Date  . A-fib (HCC)   . COPD (chronic obstructive pulmonary disease) (HCC)    not on home o2  . Emphysema of lung (HCC)   . Sciatica     Patient Active Problem List   Diagnosis Date Noted  . Altered mental status 01/04/2017  . Right upper lobe pneumonia (HCC) 01/08/2015  . Emphysema lung (HCC) 01/08/2015  . Leukocytosis 01/08/2015  . Anemia 01/08/2015  . Atrial fibrillation (HCC) 01/08/2015  . Dementia with behavioral disturbance 01/08/2015  . Dysphagia 01/08/2015  . Sepsis (HCC) 01/06/2015    Past Surgical History:  Procedure Laterality Date  . NEPHRECTOMY     Left- secondary to trauma  . right lung lesion removal      Prior to Admission medications   Medication Sig Start Date End Date Taking? Authorizing Provider  acetaminophen (TYLENOL) 325 MG tablet Take 650 mg by mouth every 6 (six) hours as needed for mild pain, moderate pain or fever.    Yes [provider]  guaiFENesin (MUCINEX) 600 MG 12 hr tablet Take 600 mg by mouth 2 (two) times daily. 09/23/17 09/30/17 Yes [provider]  meloxicam (MOBIC) 7.5 MG tablet Take 7.5 mg by mouth daily.   Yes [provider]  mirtazapine (REMERON) 7.5 MG tablet Take 7.5 mg by mouth at bedtime.   Yes [provider]  Multiple Vitamin (MULTIVITAMIN WITH MINERALS) TABS tablet Take 1 tablet by mouth daily.   Yes [provider]  omeprazole (PRILOSEC) 20 MG capsule Take 20 mg by mouth daily.   Yes [provider]  tiotropium (SPIRIVA) 18 MCG inhalation capsule Place 18 mcg into inhaler and inhale daily.   Yes [provider]  traMADol (ULTRAM) 50 MG tablet Take 50 mg by mouth 2 (two) times daily.   Yes [provider]  QUEtiapine (SEROQUEL) 25 MG tablet Take 0.5 tablets (12.5 mg total) by mouth at bedtime. Patient not taking: Reported on 01/07/2017 01/06/17   Alford Highland, MD    Allergies Patient has no known allergies.  Family History  Problem Relation Age of Onset  . Hypertension Father     Social History Social History   Tobacco Use  . Smoking status: Former Smoker    Types: Cigarettes    Last attempt to quit: 01/02/2004    Years since quitting: 13.7  . Smokeless tobacco: Never Used  . Tobacco comment: Quit 10 years ago  Substance Use Topics  . Alcohol use: No    Alcohol/week: 0.0 standard drinks  . Drug use: No    Review of Systems  Unable to obtain due to patient keeps drifting off ____________________________________________   PHYSICAL EXAM:  VITAL SIGNS: ED Triage Vitals  Enc Vitals Group     BP 09/30/17 1409 137/63     Pulse Rate 09/30/17 1409 80     Resp 09/30/17 1409 20  Temp 09/30/17 1409 (!) 101.8 F (38.8 C)     Temp Source 09/30/17 1409 Rectal     SpO2 09/30/17 1409 100 %     Weight 09/30/17 1410 138 lb 14.2 oz (63 kg)     Height 09/30/17 1410 6\' 1"  (1.854 m)     Head Circumference --      Peak Flow --      Pain Score 09/30/17 1410 0     Pain Loc --      Pain Edu? --      Excl. in GC? --     Constitutional: Sleepy but arousable answers most questions seems to be accurate but then will drift off. Eyes: Conjunctivae are normal.  Head: Atraumatic. Nose: No congestion/rhinnorhea. Mouth/Throat: Mucous membranes are moist.  Oropharynx non-erythematous. Neck: No stridor.  No cervical spine tenderness to palpation.  Neck is  supple Cardiovascular: Normal rate, regular rhythm. Grossly normal heart sounds.  Good peripheral circulation. Respiratory: Normal respiratory effort.  No retractions. Lungs CTAB. Gastrointestinal: Soft and nontender. No distention. No abdominal bruits. No CVA tenderness. Musculoskeletal: No lower extremity tenderness nor edema.  Neurologic:  Normal speech and language. No gross focal neurologic deficits are appreciated.  Skin:  Skin is warm, dry and intact. No rash noted. al.  ____________________________________________   LABS (all labs ordered are listed, but only abnormal results are displayed)  Labs Reviewed  COMPREHENSIVE METABOLIC PANEL - Abnormal; Notable for the following components:      Result Value   Glucose, Bld 133 (*)    BUN 24 (*)    Total Protein 8.2 (*)    Total Bilirubin 1.6 (*)    All other components within normal limits  CBC WITH DIFFERENTIAL/PLATELET - Abnormal; Notable for the following components:   WBC 37.2 (*)    RBC 3.68 (*)    Hemoglobin 12.3 (*)    HCT 36.0 (*)    RDW 21.3 (*)    Neutro Abs 35.4 (*)    Lymphs Abs 0.4 (*)    Monocytes Absolute 1.4 (*)    All other components within normal limits  LACTIC ACID, PLASMA - Abnormal; Notable for the following components:   Lactic Acid, Venous 2.0 (*)    All other components within normal limits  CULTURE, BLOOD (ROUTINE X 2)  CULTURE, BLOOD (ROUTINE X 2)  URINALYSIS, ROUTINE W REFLEX MICROSCOPIC  LACTIC ACID, PLASMA   ____________________________________________  EKG  EKG read and interpreted by me shows normal sinus rhythm rate of 79 left axis right bundle branch block computer also raise left anterior fascicular block no ST-T changes are seen. ____________________________________________  RADIOLOGY  ED MD interpretation:  Official radiology report(s): Dg Chest Portable 1 View  Result Date: 09/30/2017 CLINICAL DATA:  Increased weakness and falls over past weak, history dementia, atrial  fibrillation, COPD, emphysema EXAM: PORTABLE CHEST 1 VIEW COMPARISON:  Portable exam 1431 hours compared to 01/04/2017 FINDINGS: Normal heart size, mediastinal contours, and pulmonary vascularity. Progressive scarring and volume loss at both apices greater on RIGHT. Progressive interstitial lung disease changes at LEFT base versus superimposed acute infiltrate. Stable chronic interstitial disease at RIGHT base. Underlying emphysematous changes. No pleural effusion, pneumothorax or acute osseous findings. IMPRESSION: COPD changes with biapical scarring RIGHT greater than LEFT. Increased LEFT basilar infiltrate question progressing of chronic interstitial lung disease versus superimposed pneumonia. Electronically Signed   By: Ulyses Southward M.D.   On: 09/30/2017 14:49    ____________________________________________   PROCEDURES  Procedure(s) performed:   Procedures  Critical Care performed: Critical care time half an hour this includes reexamining the patient discussing the patient with hospitalist reviewing his lab work and x-rays myself. ____________________________________________   INITIAL IMPRESSION / ASSESSMENT AND PLAN / ED COURSE  Patient is awake responding to questions better.  Says he does not have a headache or neck pain or belly pain.  He does have a cough which is consistent with his pneumonia.  We will admit him for this..         ____________________________________________   FINAL CLINICAL IMPRESSION(S) / ED DIAGNOSES  Final diagnoses:  Community acquired pneumonia, unspecified laterality  Sepsis, due to unspecified organism     ED Discharge Orders    None       Note:  This document was prepared using Dragon voice recognition software and may include unintentional dictation errors.    Arnaldo Natal, MD 09/30/17 1501    Arnaldo Natal, MD 09/30/17 1501    Arnaldo Natal, MD 10/15/17 1032    Arnaldo Natal, MD 10/23/17 2137

## 2017-10-01 DIAGNOSIS — L8990 Pressure ulcer of unspecified site, unspecified stage: Secondary | ICD-10-CM

## 2017-10-01 DIAGNOSIS — J189 Pneumonia, unspecified organism: Secondary | ICD-10-CM

## 2017-10-01 DIAGNOSIS — Z7189 Other specified counseling: Secondary | ICD-10-CM

## 2017-10-01 DIAGNOSIS — Z515 Encounter for palliative care: Secondary | ICD-10-CM

## 2017-10-01 LAB — BASIC METABOLIC PANEL
Anion gap: 8 (ref 5–15)
BUN: 17 mg/dL (ref 8–23)
CALCIUM: 8.1 mg/dL — AB (ref 8.9–10.3)
CO2: 24 mmol/L (ref 22–32)
Chloride: 105 mmol/L (ref 98–111)
Creatinine, Ser: 0.76 mg/dL (ref 0.61–1.24)
GFR calc Af Amer: >60 mL/min (ref >60)
Glucose, Bld: 108 mg/dL — ABNORMAL HIGH (ref 70–99)
POTASSIUM: 3.4 mmol/L — AB (ref 3.5–5.1)
SODIUM: 137 mmol/L (ref 135–145)

## 2017-10-01 LAB — CBC
HCT: 31.2 % — ABNORMAL LOW (ref 40.0–52.0)
HEMOGLOBIN: 10.7 g/dL — AB (ref 13.0–18.0)
MCH: 33.3 pg (ref 26.0–34.0)
MCHC: 34.1 g/dL (ref 32.0–36.0)
MCV: 97.8 fL (ref 80.0–100.0)
Platelets: 232 10*3/uL (ref 150–440)
RBC: 3.2 MIL/uL — ABNORMAL LOW (ref 4.40–5.90)
RDW: 21 % — ABNORMAL HIGH (ref 11.5–14.5)
WBC: 31.3 10*3/uL — AB (ref 3.8–10.6)

## 2017-10-01 LAB — GLUCOSE, CAPILLARY: GLUCOSE-CAPILLARY: 89 mg/dL (ref 70–99)

## 2017-10-01 MED ORDER — AZITHROMYCIN 500 MG PO TABS
500.0000 mg | ORAL_TABLET | Freq: Every day | ORAL | Status: AC
  Administered 2017-10-01: 10:00:00 500 mg via ORAL
  Filled 2017-10-01: qty 1

## 2017-10-01 MED ORDER — SODIUM CHLORIDE 0.9 % IV SOLN
INTRAVENOUS | Status: DC
  Administered 2017-10-01 – 2017-10-03 (×2): via INTRAVENOUS

## 2017-10-01 MED ORDER — POTASSIUM CHLORIDE CRYS ER 20 MEQ PO TBCR
20.0000 meq | EXTENDED_RELEASE_TABLET | Freq: Once | ORAL | Status: AC
  Administered 2017-10-01: 10:00:00 20 meq via ORAL
  Filled 2017-10-01: qty 1

## 2017-10-01 MED ORDER — AZITHROMYCIN 500 MG PO TABS
250.0000 mg | ORAL_TABLET | Freq: Every day | ORAL | Status: DC
  Administered 2017-10-02 – 2017-10-04 (×3): 250 mg via ORAL
  Filled 2017-10-01 (×3): qty 1

## 2017-10-01 NOTE — Consult Note (Signed)
Consultation Note Date: 10/01/2017   Patient Name: Donald Blanchard  DOB: November 02, 1930  MRN: [id number]  Age / Sex: 84 y.o., male  PCP: Patient, No Pcp Per Referring Physician: Alford Highland, MD  Reason for Consultation: Establishing goals of care  HPI/Patient Profile: Donald Blanchard  is a 84 y.o. male with a known history of Alzheimer's dementia, depression, atrial fibrillation brought in because of fever, shortness of breath.   Clinical Assessment and Goals of Care: Patient is resting in bed with eyes closed and awaken. Daughter Donald Blanchard who is POA is at bedside. 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  She states prior to a week ago, she did not feel a meeting with palliative medicine was necessary. [name] has lived at [facility] for the past 5 years. His wife lives there as well in a different section. He is not able to see her daily.   He has been having frequent falls as he has sciatic nerve pain that causes him to drag his leg. He uses a walker. He has been sleeping in his clothes, and recently was added to the facility list for assistance with dressing. He wears depends for bladder incontinence.    He has been followed by SLP for dysphagia. His diet was alternated between a modified diet and a regular diet. Donald Blanchard states her father is proud, and does not like for his friends to see him eating a dysphagia diet as that is a sign of weakness. She also states their treatment team has suspected aspiration as the cause for pneumonia.  Today is [name] birthday and the family does not want to discuss GOC and advanced directives today. Will re-meet tomorrow at 12:30.         SUMMARY OF RECOMMENDATIONS   Re-meet for GOC tomorrow at 12:30.    Code Status/Advance Care Planning:  DNR    Symptom Management:   Per primary team.   Palliative Prophylaxis:   Eye Care and Oral Care   Prognosis:   Poor  overall.   Discharge Planning: To Be Determined      Primary Diagnoses: Present on Admission: . Sepsis (HCC)   I have reviewed the medical record, interviewed the patient and family, and examined the patient. The following aspects are pertinent.  Past Medical History:  Diagnosis Date  . A-fib (HCC)   . COPD (chronic obstructive pulmonary disease) (HCC)    not on home o2  . Emphysema of lung (HCC)   . Sciatica    Social History   Socioeconomic History  . Marital status: Married    Spouse name: Not on file  . Number of children: Not on file  . Years of education: Not on file  . Highest education level: Not on file  Occupational History  . Not on file  Social Needs  . Financial resource strain: Not very hard  . Food insecurity:    Worry: Never true    Inability: Never true  . Transportation needs:    Medical: No    Non-medical:  No  Tobacco Use  . Smoking status: Former Smoker    Types: Cigarettes    Last attempt to quit: 01/02/2004    Years since quitting: 13.7  . Smokeless tobacco: Never Used  . Tobacco comment: Quit 10 years ago  Substance and Sexual Activity  . Alcohol use: No    Alcohol/week: 0.0 standard drinks  . Drug use: No  . Sexual activity: Never  Lifestyle  . Physical activity:    Days per week: Not on file    Minutes per session: Not on file  . Stress: Not on file  Relationships  . Social connections:    Talks on phone: Not on file    Gets together: Not on file    Attends religious service: Not on file    Active member of club or organization: Not on file    Attends meetings of clubs or organizations: Not on file    Relationship status: Not on file  Other Topics Concern  . Not on file  Social History Narrative   Lives at home with wife. Has a walker to ambulate   Family History  Problem Relation Age of Onset  . Hypertension Father    Scheduled Meds: . [START ON 10/02/2017] azithromycin  250 mg Oral Daily  . docusate sodium  100 mg Oral  BID  . heparin  5,000 Units Subcutaneous Q8H  . ipratropium-albuterol  3 mL Nebulization Q6H  . mirtazapine  7.5 mg Oral QHS  . pantoprazole  40 mg Oral Daily  . QUEtiapine  12.5 mg Oral QHS   Continuous Infusions: . sodium chloride 30 mL/hr at 10/01/17 1018  . piperacillin-tazobactam (ZOSYN)  IV 3.375 g (10/01/17 0559)   PRN Meds:.acetaminophen **OR** acetaminophen, bisacodyl, HYDROcodone-acetaminophen, ondansetron **OR** ondansetron (ZOFRAN) IV, traZODone Medications Prior to Admission:  Prior to Admission medications   Medication Sig Start Date End Date Taking? Authorizing Provider  acetaminophen (TYLENOL) 325 MG tablet Take 650 mg by mouth every 6 (six) hours as needed for mild pain, moderate pain or fever.    Yes [provider]  meloxicam (MOBIC) 7.5 MG tablet Take 7.5 mg by mouth daily.   Yes [provider]  mirtazapine (REMERON) 7.5 MG tablet Take 7.5 mg by mouth at bedtime.   Yes [provider]  Multiple Vitamin (MULTIVITAMIN WITH MINERALS) TABS tablet Take 1 tablet by mouth daily.   Yes [provider]  omeprazole (PRILOSEC) 20 MG capsule Take 20 mg by mouth daily.   Yes [provider]  tiotropium (SPIRIVA) 18 MCG inhalation capsule Place 18 mcg into inhaler and inhale daily.   Yes [provider]  traMADol (ULTRAM) 50 MG tablet Take 50 mg by mouth 2 (two) times daily.   Yes [provider]  QUEtiapine (SEROQUEL) 25 MG tablet Take 0.5 tablets (12.5 mg total) by mouth at bedtime. Patient not taking: Reported on 01/07/2017 01/06/17   Alford Highland, MD   No Known Allergies Review of Systems  Unable to perform ROS   Physical Exam  Constitutional: No distress.  Pulmonary/Chest: Effort normal.  Neurological: He is alert.    Vital Signs: BP 118/69 (BP Location: Right Arm)   Pulse 83   Temp 97.9 F (36.6 C) (Oral)   Resp 18   Ht 6' (1.829 m)   Wt 58.8 kg   SpO2 92%   BMI 17.59 kg/m  Pain Scale: PAINAD     Pain Score: 0-No pain   SpO2: SpO2: 92 % O2 Device:SpO2: 92 %  O2 Flow Rate: .   IO: Intake/output summary:   Intake/Output Summary (Last 24 hours) at 10/01/2017 1303 Last data filed at 10/01/2017 0300 Gross per 24 hour  Intake 926.02 ml  Output -  Net 926.02 ml    LBM: Last BM Date: (utd pta) Baseline Weight: Weight: 63 kg Most recent weight: Weight: (weight not accurate due to bed)     Palliative Assessment/Data:     Time In: 12:30 Time Out: 1:20 Time Total: 50 min Greater than 50%  of this time was spent counseling and coordinating care related to the above assessment and plan.  Signed by: Morton Stall, NP   Please contact Palliative Medicine Team phone at [phone] for questions and concerns.  For individual provider: See Loretha Stapler

## 2017-10-01 NOTE — Evaluation (Signed)
Clinical/Bedside Swallow Evaluation Patient Details  Name: Donald Blanchard MRN: [id number] Date of Birth: 05-Jan-1930  Today's Date: 10/01/2017 Time: SLP Start Time (ACUTE ONLY): 1020 SLP Stop Time (ACUTE ONLY): 1120 SLP Time Calculation (min) (ACUTE ONLY): 60 min  Past Medical History:  Past Medical History:  Diagnosis Date  . A-fib (HCC)   . COPD (chronic obstructive pulmonary disease) (HCC)    not on home o2  . Emphysema of lung (HCC)   . Sciatica    Past Surgical History:  Past Surgical History:  Procedure Laterality Date  . NEPHRECTOMY     Left- secondary to trauma  . right lung lesion removal     HPI:  Pt is a 84 y.o. male with a known history of advanced Alzheimer's Dementia, depression, atrial fibrillation, COPD brought in because of fever, shortness of breath. He has been having frequent falls as he has sciatic nerve pain that causes him to drag his leg. He uses a walker. He has been sleeping in his clothes, and recently was added to the facility list for assistance with dressing. He wears depends for bladder incontinence. He has been followed by SLP for dysphagia. His diet was alternated between a modified diet and a regular diet. Donald Blanchard states her father is proud, and does not like for his friends to see him eating a dysphagia diet as that is a sign of weakness. She also states their treatment team has suspected aspiration as the cause for pneumonia. Chest x-ray showed Increased LEFT basilar infiltrate question progressing of chronic interstitial lung disease versus superimposed pneumonia.    Assessment / Plan / Recommendation Clinical Impression  Pt appears to present w/ overt presentation of oropharyngeal phase dysphagia w/ increased risk for aspiration thus impact on the Pulmonary status; also risk for inability to sufficiently, safely meet his nutritional needs. Pt consumed po trials of Nectar consistency liquids via TSP and Cup then puree foods no immediate, overt s/s of  aspiration noted; vocal quality during mumbled speech or respiratory status post trials. However, suspect delay in pharyngeal swallowing and noted multiple swallows to clear boluses as well as a delayed throat clearing post trial of Nectar liquids x1. During the oral phase, pt exhibited prolonged oral phase time w/ oral holding and min residue post swallow; overall decreased attention to bolus/task. Over time and given verbal/tactile cues, he achieved A-P transfer and cleared boluses w/ no increased of bolus residue in the buccal areas. Gave mod. verbal/tactile cues frequently during the po trials to promote oral attention to task/po's. Much support was given to achieve a more mid-line head positioning for safer, comfortable swallowing. Pt was unable to follow through w/ instructions effectively. OM exam revealed overall weakness w/ open-mouth posture at rest. Due to pt's increased risk for aspiration from Dysphagia at this time in light of his baseline advanced Lewy Body Dementia, recommend a Dysphagia level 1 w/ Nectar liquids diet w/ aspiration precautions; Pills in Puree - Crushed. Feeding Support at all meals; Supervision. NSG updated.  SLP Visit Diagnosis: Dysphagia, oropharyngeal phase (R13.12)    Aspiration Risk  Moderate aspiration risk;Risk for inadequate nutrition/hydration    Diet Recommendation  Dysphagia level 1 (puree) w/ Nectar consistency liquids; strict aspiration precautions; feeding support at meals - supervision to monitor toleration of diet  Medication Administration: Crushed with puree(as able)    Other  Recommendations Recommended Consults: (Dietician f/u; Palliative Care consult) Oral Care Recommendations: Oral care BID;Staff/trained caregiver to provide oral care Other Recommendations: Order thickener from pharmacy;Prohibited food (  jello, ice cream, thin soups);Remove water pitcher;Have oral suction available   Follow up Recommendations Skilled Nursing facility       Frequency and Duration min 2x/week  1 week       Prognosis Prognosis for Safe Diet Advancement: Guarded(-Fair) Barriers to Reach Goals: Cognitive deficits;Time post onset;Severity of deficits Barriers/Prognosis Comment: f/u at skilled setting for least restrictive diet if appropriate      Swallow Study   General Date of Onset: 09/30/17 HPI: Pt is a 84 y.o. male with a known history of advanced Alzheimer's Dementia, depression, atrial fibrillation, COPD brought in because of fever, shortness of breath. He has been having frequent falls as he has sciatic nerve pain that causes him to drag his leg. He uses a walker. He has been sleeping in his clothes, and recently was added to the facility list for assistance with dressing. He wears depends for bladder incontinence. He has been followed by SLP for dysphagia. His diet was alternated between a modified diet and a regular diet. Donald Blanchard states her father is proud, and does not like for his friends to see him eating a dysphagia diet as that is a sign of weakness. She also states their treatment team has suspected aspiration as the cause for pneumonia. Chest x-ray showed Increased LEFT basilar infiltrate question progressing of chronic interstitial lung disease versus superimposed pneumonia.  Type of Study: Bedside Swallow Evaluation Previous Swallow Assessment: none noted Diet Prior to this Study: NPO Temperature Spikes Noted: No(wbc lowering to 31.3) Respiratory Status: Nasal cannula(2 liters but taking it off at times) History of Recent Intubation: No Behavior/Cognition: Cooperative;Confused;Distractible;Requires cueing;Doesn't follow directions(awake) Oral Cavity Assessment: (sticky) Oral Care Completed by SLP: Yes Oral Cavity - Dentition: Adequate natural dentition;Missing dentition Vision: (n/a) Self-Feeding Abilities: Total assist(d/t declined Cognitive status) Patient Positioning: Upright in bed(needed positioning; support of head more  mid-line) Baseline Vocal Quality: Low vocal intensity(mumbled, muttered speech) Volitional Cough: Cognitively unable to elicit Volitional Swallow: Unable to elicit    Oral/Motor/Sensory Function Overall Oral Motor/Sensory Function: Generalized oral weakness(open mouth posture at rest and during mumbled speech)   Ice Chips Ice chips: Not tested Other Comments: d/t Cognitive decline; dysphagia history   Thin Liquid Thin Liquid: Not tested Other Comments: d/t Cognitive decline; dysphagia history    Nectar Thick Nectar Thick Liquid: Impaired Presentation: Cup;Spoon(had to feed pt d/t Cognition; 3 trials via Cup; 6 TSP) Oral Phase Impairments: Reduced lingual movement/coordination;Poor awareness of bolus;Reduced labial seal Oral phase functional implications: Prolonged oral transit;Oral residue;Oral holding Pharyngeal Phase Impairments: Suspected delayed Swallow;Multiple swallows;Throat Clearing - Delayed(x1) Other Comments: verbal cues given intermittently to promote oral attention to task   Honey Thick Honey Thick Liquid: Not tested   Puree Puree: Impaired Presentation: Spoon(fed; 8 trials) Oral Phase Impairments: Reduced labial seal;Reduced lingual movement/coordination;Poor awareness of bolus Oral Phase Functional Implications: Prolonged oral transit;Oral residue;Oral holding Pharyngeal Phase Impairments: Suspected delayed Swallow;Multiple swallows Other Comments: verbal cues given intermittently to promote oral attention to task   Solid     Solid: Not tested Other Comments: d/t Cognitive status       Jerilynn Som, MS, CCC-SLP Luevenia Mcavoy 10/01/2017,4:41 PM

## 2017-10-01 NOTE — Evaluation (Signed)
Physical Therapy Evaluation Patient Details Name: Donald Blanchard MRN: [id number] DOB: September 18, 1930 Today's Date: 10/01/2017   History of Present Illness  Patient is an 84 year old male admitted from SNF with increased falls, increased weakness. Found to have sepsis and PNA.     Clinical Impression  Patient received in bed, alert, folding towel. Verbalizes however is difficult to understand. Patient not oriented. Patient easily distracted with bed sheets, gown, etc, requiring max assist to perform supine >< sit. Once assisted to sitting on eob, patient able to sit with assist of hand rail. Mod assist needed for sit to stand, patient demonstrating decreased safety when standing by pushing walker forward, leaning heavily to left when standing.  Patient able to weight shift in standing but due to distractibility, felt it was unsafe to attempt ambulation at this time. Patient will benefit from skilled PT to address his functional limitations and to return to facility at discharge.           Follow Up Recommendations SNF    Equipment Recommendations  None recommended by PT    Recommendations for Other Services       Precautions / Restrictions Precautions Precautions: Fall Restrictions Weight Bearing Restrictions: No      Mobility  Bed Mobility Overal bed mobility: Needs Assistance Bed Mobility: Supine to Sit;Sit to Supine     Supine to sit: Max assist Sit to supine: Max assist   General bed mobility comments: Patient having difficulty staying on task, following directions, therefore assisted with bed mobility.  Transfers Overall transfer level: Needs assistance Equipment used: Rolling walker (2 wheeled) Transfers: Sit to/from Stand Sit to Stand: Mod assist         General transfer comment: patient requires bed raised, mod assist. leans heavily to his left side when standing. Performed weight shifting at eob once standing. Decreased safety awareness demonstrated by patient pushing  walker away from him when in standing.   Ambulation/Gait             General Gait Details: Unable/unsafe to ambulate at this time   Stairs            Wheelchair Mobility    Modified Rankin (Stroke Patients Only)       Balance Overall balance assessment: Needs assistance Sitting-balance support: Bilateral upper extremity supported Sitting balance-Leahy Scale: Fair Sitting balance - Comments: requires assist until fully positioned at eob, requires close supervision due to cognitive limitations.  Postural control: Left lateral lean Standing balance support: Bilateral upper extremity supported Standing balance-Leahy Scale: Fair Standing balance comment: leaning heavily to left                             Pertinent Vitals/Pain Pain Assessment: No/denies pain    Home Living Family/patient expects to be discharged to:: Skilled nursing facility                      Prior Function Level of Independence: Needs assistance   Gait / Transfers Assistance Needed: Patient reports he uses walker, leans heavily to his left side when standing. Decreased safety, requires max assist to get in/out of bed due to cognitive limitations.   ADL's / Homemaking Assistance Needed: Patient resides at [facility], staff assists with all needs  Comments: Per RN patient is assisted into chair with RW at home.      Hand Dominance        Extremity/Trunk Assessment  Upper Extremity Assessment Upper Extremity Assessment: Difficult to assess due to impaired cognition    Lower Extremity Assessment Lower Extremity Assessment: Difficult to assess due to impaired cognition       Communication   Communication: Expressive difficulties  Cognition Arousal/Alertness: Awake/alert Behavior During Therapy: Restless Overall Cognitive Status: No family/caregiver present to determine baseline cognitive functioning                                 General Comments:  patient alert in room folding towel, once towel removed, patient trying to pull at bed sheets, gown etc. Difficulty to get him to focus on task at hand.       General Comments      Exercises     Assessment/Plan    PT Assessment Patient needs continued PT services  PT Problem List Decreased mobility;Decreased safety awareness;Decreased activity tolerance;Decreased cognition;Decreased balance       PT Treatment Interventions Therapeutic activities;Functional mobility training;Gait training    PT Goals (Current goals can be found in the Care Plan section)  Acute Rehab PT Goals Patient Stated Goal: to return to facility PT Goal Formulation: Patient unable to participate in goal setting Time For Goal Achievement: 10/15/17 Potential to Achieve Goals: Fair    Frequency Min 2X/week   Barriers to discharge        Co-evaluation               AM-PAC PT "6 Clicks" Daily Activity  Outcome Measure Difficulty turning over in bed (including adjusting bedclothes, sheets and blankets)?: Unable Difficulty moving from lying on back to sitting on the side of the bed? : Unable Difficulty sitting down on and standing up from a chair with arms (e.g., wheelchair, bedside commode, etc,.)?: Unable Help needed moving to and from a bed to chair (including a wheelchair)?: A Lot Help needed walking in hospital room?: Total Help needed climbing 3-5 steps with a railing? : Total 6 Click Score: 7    End of Session Equipment Utilized During Treatment: Gait belt Activity Tolerance: Patient tolerated treatment well Patient left: in bed;with bed alarm set Nurse Communication: Mobility status PT Visit Diagnosis: Other abnormalities of gait and mobility (R26.89);History of falling (Z91.81);Difficulty in walking, not elsewhere classified (R26.2);Muscle weakness (generalized) (M62.81);Repeated falls (R29.6);Unsteadiness on feet (R26.81)    Time: 1135-1200 PT Time Calculation (min) (ACUTE ONLY): 25  min   Charges:   PT Evaluation $PT Eval Moderate Complexity: 1 Mod          Evaristo Tsuda, PT, GCS 10/01/17,12:23 PM

## 2017-10-01 NOTE — Plan of Care (Signed)
SPEECH SAW TODAY DIET CHANGED TO NECTAR THICK

## 2017-10-01 NOTE — Progress Notes (Signed)
Initial Nutrition Assessment  DOCUMENTATION CODES:   Severe malnutrition in context of chronic illness, Underweight  INTERVENTION:  Provide chocolate Hormel Shake (Vital Cuisine) po TID with meals, each supplement provides 520 kcal and 22 grams of protein.  NUTRITION DIAGNOSIS:   Severe Malnutrition related to chronic illness(emphysema, COPD, dementia, advanced age) as evidenced by severe fat depletion, severe muscle depletion.  GOAL:   Patient will meet greater than or equal to 90% of their needs  MONITOR:   PO intake, Supplement acceptance, Labs, Weight trends, I & O's, Skin  REASON FOR ASSESSMENT:   Malnutrition Screening Tool    ASSESSMENT:   84 year old male with PMHx of Alzheimer's dementia, depression, emphysema of lung, COPD, sciatica, A-fib who is admitted from [facility] with sepsis due to PNA.   -Following SLP evaluation today patient was placed on dysphagia 1 diet with nectar-thick liquids.  Met with patient at bedside. Today is patient's birthday. He is unable to provide any history in setting of dementia. He reports a good appetite today. Did note he ate 100% of his lunch according to chart. He also reports enjoying chocolate milkshakes. No family members present at time of RD assessment.   Per weight history in chart he has been 61.2-63.5 kg the past year. He is currently 58.8 kg (129.7 lbs).  Meal Completion: 100%  Medications reviewed and include: azithromycin, Colace, Remeron 7.5 mg QHS, pantoprazole, Seroquel QHS, NS @ 30 mL/hr, Zosyn.  Labs reviewed: CBG 89, Potassium 3.4.  Discussed with SLP.  NUTRITION - FOCUSED PHYSICAL EXAM:    Most Recent Value  Orbital Region  Severe depletion  Upper Arm Region  Severe depletion  Thoracic and Lumbar Region  Severe depletion  Buccal Region  Severe depletion  Temple Region  Severe depletion  Clavicle Bone Region  Severe depletion  Clavicle and Acromion Bone Region  Severe depletion  Scapular Bone Region   Unable to assess  Dorsal Hand  Severe depletion  Patellar Region  Severe depletion  Anterior Thigh Region  Severe depletion  Posterior Calf Region  Severe depletion  Edema (RD Assessment)  None  Hair  Reviewed  Eyes  Reviewed  Mouth  Unable to assess  Skin  Reviewed  Nails  Reviewed     Diet Order:   Diet Order            DIET - DYS 1 Room service appropriate? Yes with Assist; Fluid consistency: Nectar Thick  Diet effective now              EDUCATION NEEDS:   Not appropriate for education at this time  Skin:  Skin Assessment: Skin Integrity Issues: Skin Integrity Issues:: Stage I Stage I: coccyx  Last BM:  Unknown/PTA  Height:   Ht Readings from Last 1 Encounters:  09/30/17 6' (1.829 m)    Weight:   Wt Readings from Last 1 Encounters:  09/30/17 58.8 kg    Ideal Body Weight:  80.9 kg  BMI:  Body mass index is 17.59 kg/m.  Estimated Nutritional Needs:   Kcal:  1470-1765 (25-30 kcal/kg)  Protein:  80-90 grams (1.4-1.5 grams/kg)  Fluid:  1.5 L/day  Willey Blade, MS, RD, LDN Office: [phone] Pager: [phone] After Hours/Weekend Pager: [phone]

## 2017-10-01 NOTE — Progress Notes (Signed)
Patient ID: Donald Blanchard, male   DOB: [birth year], 84 y.o.   MRN: [id number]  [employer] PROGRESS NOTE  Donald Blanchard:[id number] DOB: 1930-01-08 DOA: 09/30/2017 PCP: Patient, No Pcp Per  HPI/Subjective: Patient answers some yes or no questions.  States he walks around.  Having some cough and congestion.  Complains of some weakness.  Lives at [facility] assisted [facility] with his wife  Objective: Vitals:   10/01/17 0205 10/01/17 0439  BP:  118/69  Pulse:  83  Resp:  18  Temp:  97.9 F (36.6 C)  SpO2: 91% 92%    Filed Weights   09/30/17 1410 09/30/17 1658  Weight: 63 kg 58.8 kg    ROS: Review of Systems  Constitutional: Negative for chills and fever.  Eyes: Negative for blurred vision.  Respiratory: Positive for cough and shortness of breath.   Cardiovascular: Negative for chest pain.  Gastrointestinal: Negative for abdominal pain, constipation, diarrhea, nausea and vomiting.  Genitourinary: Negative for dysuria.  Musculoskeletal: Positive for back pain. Negative for joint pain.  Neurological: Negative for dizziness and headaches.   Exam: Physical Exam  HENT:  Nose: No mucosal edema.  Mouth/Throat: No oropharyngeal exudate or posterior oropharyngeal edema.  Eyes: Pupils are equal, round, and reactive to light. Conjunctivae, EOM and lids are normal.  Neck: No JVD present. Carotid bruit is not present. No edema present. No thyroid mass and no thyromegaly present.  Cardiovascular: S1 normal and S2 normal. Exam reveals no gallop.  No murmur heard. Pulses:      Dorsalis pedis pulses are 2+ on the right side, and 2+ on the left side.  Respiratory: No respiratory distress. He has no wheezes. He has no rhonchi. He has no rales.  GI: Soft. Bowel sounds are normal. There is no tenderness.  Musculoskeletal:       Right ankle: He exhibits swelling.       Left ankle: He exhibits swelling.  Lymphadenopathy:    He has no cervical adenopathy.  Neurological: He is alert. No  cranial nerve deficit.  Skin: Skin is warm. No rash noted. Nails show no clubbing.  Psychiatric: He has a normal mood and affect.      Data Reviewed: Basic Metabolic Panel: Recent Labs  Lab 09/30/17 1413 10/01/17 0651  NA 136 137  K 4.2 3.4*  CL 100 105  CO2 27 24  GLUCOSE 133* 108*  BUN 24* 17  CREATININE 0.99 0.76  CALCIUM 9.3 8.1*   Liver Function Tests: Recent Labs  Lab 09/30/17 1413  AST 30  ALT 11  ALKPHOS 98  BILITOT 1.6*  PROT 8.2*  ALBUMIN 3.8    CBC: Recent Labs  Lab 09/30/17 1413 10/01/17 0651  WBC 37.2* 31.3*  NEUTROABS 35.4*  --   HGB 12.3* 10.7*  HCT 36.0* 31.2*  MCV 97.9 97.8  PLT 280 232    CBG: Recent Labs  Lab 10/01/17 0742  GLUCAP 89    Recent Results (from the past 240 hour(s))  Blood Culture (routine x 2)     Status: None (Preliminary result)   Collection Time: 09/30/17  2:14 PM  Result Value Ref Range Status   Specimen Description BLOOD LEFT FOREARM  Final   Special Requests   Final    BOTTLES DRAWN AEROBIC AND ANAEROBIC Blood Culture results may not be optimal due to an excessive volume of blood received in culture bottles   Culture   Final    NO GROWTH < 24 HOURS Performed at [facility],  [address] [place], [place] [address]    Report Status PENDING  Incomplete  Blood Culture (routine x 2)     Status: None (Preliminary result)   Collection Time: 09/30/17  2:20 PM  Result Value Ref Range Status   Specimen Description BLOOD RIGHT FA  Final   Special Requests   Final    BOTTLES DRAWN AEROBIC AND ANAEROBIC Blood Culture results may not be optimal due to an excessive volume of blood received in culture bottles   Culture   Final    NO GROWTH < 24 HOURS Performed at [facility], [address], [place], [place] [address]    Report Status PENDING  Incomplete  MRSA PCR Screening     Status: None   Collection Time: 09/30/17  5:37 PM  Result Value Ref Range Status   MRSA by PCR NEGATIVE NEGATIVE  Final    Comment:        The GeneXpert MRSA Assay (FDA approved for NASAL specimens only), is one component of a comprehensive MRSA colonization surveillance program. It is not intended to diagnose MRSA infection nor to guide or monitor treatment for MRSA infections. Performed at [facility], [address] [place], [place] [address]      Studies: Dg Chest Portable 1 View  Result Date: 09/30/2017 CLINICAL DATA:  Increased weakness and falls over past weak, history dementia, atrial fibrillation, COPD, emphysema EXAM: PORTABLE CHEST 1 VIEW COMPARISON:  Portable exam 1431 hours compared to 01/04/2017 FINDINGS: Normal heart size, mediastinal contours, and pulmonary vascularity. Progressive scarring and volume loss at both apices greater on RIGHT. Progressive interstitial lung disease changes at LEFT base versus superimposed acute infiltrate. Stable chronic interstitial disease at RIGHT base. Underlying emphysematous changes. No pleural effusion, pneumothorax or acute osseous findings. IMPRESSION: COPD changes with biapical scarring RIGHT greater than LEFT. Increased LEFT basilar infiltrate question progressing of chronic interstitial lung disease versus superimposed pneumonia. Electronically Signed   By: Ulyses Southward M.D.   On: 09/30/2017 14:49    Scheduled Meds: . [START ON 10/02/2017] azithromycin  250 mg Oral Daily  . docusate sodium  100 mg Oral BID  . heparin  5,000 Units Subcutaneous Q8H  . ipratropium-albuterol  3 mL Nebulization Q6H  . mirtazapine  7.5 mg Oral QHS  . pantoprazole  40 mg Oral Daily  . QUEtiapine  12.5 mg Oral QHS   Continuous Infusions: . sodium chloride 30 mL/hr at 10/01/17 1018  . piperacillin-tazobactam (ZOSYN)  IV 3.375 g (10/01/17 0559)    Assessment/Plan:  1. Sepsis with fever leukocytosis and lactic acidosis.  Pneumonia on chest x-ray.  Zosyn and vancomycin initially prescribed.  Since MRSA PCR negative I will discontinue vancomycin and add  Zithromax.  Decrease rate of IV fluids. 2. Alzheimer's dementia.  On Seroquel at night 3. Weakness.  Physical therapy evaluation 4. Depression on Remeron 5. COPD 6. History of paroxysmal atrial fibrillation 7. Swallow evaluation 8. Daughter requested palliative care consultation  Code Status:     Code Status Orders  (From admission, onward)         Start     Ordered   09/30/17 1546  Do not attempt resuscitation (DNR)  Continuous    Question Answer Comment  In the event of cardiac or respiratory ARREST Do not call a "code blue"   In the event of cardiac or respiratory ARREST Do not perform Intubation, CPR, defibrillation or ACLS   In the event of cardiac or respiratory ARREST Use medication  by any route, position, wound care, and other measures to relive pain and suffering. May use oxygen, suction and manual treatment of airway obstruction as needed for comfort.   Comments NMP      09/30/17 1545        Code Status History    Date Active Date Inactive Code Status Order ID Comments User Context   09/30/2017 1525 09/30/2017 1545 Full Code [id number]  Katha Hamming, MD ED   01/04/2017 1433 01/06/2017 1523 DNR [id number]  Houston Siren, MD ED   01/06/2015 2209 01/08/2015 1709 Full Code [id number]  Enid Baas, MD Inpatient    Advance Directive Documentation     Most Recent Value  Type of Advance Directive  Healthcare Power of Attorney  Pre-existing out of facility DNR order (yellow form or pink MOST form)  Yellow form placed in chart (order not valid for inpatient use)  "MOST" Form in Place?  -     Family Communication: Spoke with daughter on the phone Disposition Plan: Hopefully back to facility with home health but likely will need a few days here in the hospital  Consultants:  Palliative care  Speech therapy  Antibiotics:  Zosyn  Zithromax  Time spent: 28 minutes  Derryck Shahan [employer]

## 2017-10-02 DIAGNOSIS — E43 Unspecified severe protein-calorie malnutrition: Secondary | ICD-10-CM

## 2017-10-02 LAB — BASIC METABOLIC PANEL
ANION GAP: 6 (ref 5–15)
BUN: 16 mg/dL (ref 8–23)
CALCIUM: 8.4 mg/dL — AB (ref 8.9–10.3)
CO2: 25 mmol/L (ref 22–32)
Chloride: 107 mmol/L (ref 98–111)
Creatinine, Ser: 0.71 mg/dL (ref 0.61–1.24)
GLUCOSE: 116 mg/dL — AB (ref 70–99)
Potassium: 3.6 mmol/L (ref 3.5–5.1)
SODIUM: 138 mmol/L (ref 135–145)

## 2017-10-02 LAB — CBC
HCT: 31.5 % — ABNORMAL LOW (ref 40.0–52.0)
Hemoglobin: 11 g/dL — ABNORMAL LOW (ref 13.0–18.0)
MCH: 34 pg (ref 26.0–34.0)
MCHC: 35.1 g/dL (ref 32.0–36.0)
MCV: 97 fL (ref 80.0–100.0)
PLATELETS: 227 10*3/uL (ref 150–440)
RBC: 3.24 MIL/uL — AB (ref 4.40–5.90)
RDW: 20.7 % — ABNORMAL HIGH (ref 11.5–14.5)
WBC: 21.5 10*3/uL — AB (ref 3.8–10.6)

## 2017-10-02 LAB — GLUCOSE, CAPILLARY: GLUCOSE-CAPILLARY: 103 mg/dL — AB (ref 70–99)

## 2017-10-02 MED ORDER — SODIUM CHLORIDE 0.9 % IV SOLN
3.0000 g | Freq: Four times a day (QID) | INTRAVENOUS | Status: DC
  Administered 2017-10-02 – 2017-10-04 (×8): 3 g via INTRAVENOUS
  Filled 2017-10-02 (×10): qty 3

## 2017-10-02 NOTE — [place] FL2 (Signed)
[place] MEDICAID FL2 LEVEL OF CARE SCREENING TOOL     IDENTIFICATION  Patient Name: Donald Blanchard Birthdate: 1930/04/30 Sex: male Admission Date (Current Location): 09/30/2017  [place] and [place] Number:  Chiropodist and Address:  [facility], [address], [place], [place] [address]      Provider Number: [id number]  Attending Physician Name and Address:  Alford Highland, MD  Relative Name and Phone Number:  Berton Bon- daughter [phone]    Current Level of Care: Hospital Recommended Level of Care: Skilled Nursing Facility Prior Approval Number:    Date Approved/Denied:   PASRR Number: [id number]  Discharge Plan: SNF    Current Diagnoses: Patient Active Problem List   Diagnosis Date Noted  . Protein-calorie malnutrition, severe 10/02/2017  . Pressure injury of skin 10/01/2017  . Altered mental status 01/04/2017  . Right upper lobe pneumonia (HCC) 01/08/2015  . Emphysema lung (HCC) 01/08/2015  . Leukocytosis 01/08/2015  . Anemia 01/08/2015  . Atrial fibrillation (HCC) 01/08/2015  . Dementia with behavioral disturbance (HCC) 01/08/2015  . Dysphagia 01/08/2015  . Sepsis (HCC) 01/06/2015    Orientation RESPIRATION BLADDER Height & Weight     Self, Place  Normal Incontinent Weight: (weight not accurate due to bed) Height:  6' (182.9 cm)  BEHAVIORAL SYMPTOMS/MOOD NEUROLOGICAL BOWEL NUTRITION STATUS  (none) (none) Continent Diet(Dysphagia 1 )  AMBULATORY STATUS COMMUNICATION OF NEEDS Skin   Extensive Assist Verbally Normal                       Personal Care Assistance Level of Assistance  Bathing, Feeding, Dressing Bathing Assistance: Limited assistance Feeding assistance: Independent Dressing Assistance: Limited assistance     Functional Limitations Info  Sight, Hearing, Speech Sight Info: Adequate Hearing Info: Adequate Speech Info: Adequate    SPECIAL CARE FACTORS FREQUENCY  PT (By licensed PT), OT  (By licensed OT)     PT Frequency: 5 OT Frequency: 5            Contractures Contractures Info: Not present    Additional Factors Info  Code Status, Allergies Code Status Info: DNR Allergies Info: NKA           Current Medications (10/02/2017):  This is the current hospital active medication list Current Facility-Administered Medications  Medication Dose Route Frequency Provider Last Rate Last Dose  . 0.9 %  sodium chloride infusion   Intravenous Continuous Alford Highland, MD 30 mL/hr at 10/01/17 1600    . acetaminophen (TYLENOL) tablet 650 mg  650 mg Oral Q6H PRN Katha Hamming, MD       Or  . acetaminophen (TYLENOL) suppository 650 mg  650 mg Rectal Q6H PRN Katha Hamming, MD      . Ampicillin-Sulbactam (UNASYN) 3 g in sodium chloride 0.9 % 100 mL IVPB  3 g Intravenous Q6H Wieting, Richard, MD      . azithromycin [facility]) tablet 250 mg  250 mg Oral Daily Alford Highland, MD   250 mg at 10/02/17 0945  . bisacodyl (DULCOLAX) EC tablet 5 mg  5 mg Oral Daily PRN Katha Hamming, MD      . docusate sodium (COLACE) capsule 100 mg  100 mg Oral BID Katha Hamming, MD   100 mg at 10/02/17 0945  . heparin injection 5,000 Units  5,000 Units Subcutaneous Q8H Katha Hamming, MD   5,000 Units at 10/02/17 0551  . HYDROcodone-acetaminophen (NORCO/VICODIN) 5-325 MG per tablet 1-2 tablet  1-2 tablet Oral Q4H PRN  Katha Hamming, MD      . ipratropium-albuterol (DUONEB) 0.5-2.5 (3) MG/3ML nebulizer solution 3 mL  3 mL Nebulization Q6H Katha Hamming, MD   3 mL at 10/02/17 1314  . mirtazapine (REMERON) tablet 7.5 mg  7.5 mg Oral QHS Katha Hamming, MD   7.5 mg at 10/01/17 2251  . ondansetron (ZOFRAN) tablet 4 mg  4 mg Oral Q6H PRN Katha Hamming, MD       Or  . ondansetron (ZOFRAN) injection 4 mg  4 mg Intravenous Q6H PRN Katha Hamming, MD      . pantoprazole (PROTONIX) EC tablet 40 mg  40 mg Oral Daily Katha Hamming, MD   40  mg at 10/02/17 0945  . QUEtiapine (SEROQUEL) tablet 12.5 mg  12.5 mg Oral QHS Katha Hamming, MD   12.5 mg at 10/01/17 2252  . traZODone (DESYREL) tablet 25 mg  25 mg Oral QHS PRN Katha Hamming, MD   25 mg at 10/02/17 0115     Discharge Medications: Please see discharge summary for a list of discharge medications.  Relevant Imaging Results:  Relevant Lab Results:   Additional Information [id number] SSN   Ruthe Mannan, [place]

## 2017-10-02 NOTE — Progress Notes (Addendum)
Daily Progress Note   Patient Name: Donald Blanchard       Date: 10/02/2017 DOB: 1930-12-13  Age: 84 y.o. MRN#: [id number] Attending Physician: Alford Highland, MD Primary Care Physician: Patient, No Pcp Per Admit Date: 09/30/2017  Reason for Consultation/Follow-up: Establishing goals of care  Subjective: Patient is resting in bed. He is difficult to understand and somewhat confused. Daughter who is POA is at bedside.    We discussed his diagnosis, prognosis, and GOC.  A detailed discussion was had today regarding advanced directives.  Concepts specific to code status, artifical feeding and hydration, IV antibiotics and rehospitalization were discussed.  The difference between an aggressive medical intervention path and a comfort care path was discussed.  Values and goals of care important to patient and family were attempted to be elicited.  Discussed limitations of medical interventions to prolong quality of life for this patient at this time in this situation and discussed the concept of human mortality.   Daughter requesting him to make decisions; some responses demonstrate confusion. We discussed his swallowing.  He states he would not want a feeding tube. He would like to continue on SLP recommended diet as long as possible, and if a time comes his swallowing declines to a place where an NPO status is recommended, he will consider a shift to hospice care. He states he would want to return to the hospital if needed to treat the treatable, he tells me he is not ready to give up. If he returns with aspiration PNA after following the suggested diet, they will discuss further. He confirms DNR/DNI status.   Following conversation, daughter/POA states his decisions today are in line with previous  conversations they have had, and she is comfortable with his decisions.       Length of Stay: 2  Current Medications: Scheduled Meds:  . azithromycin  250 mg Oral Daily  . docusate sodium  100 mg Oral BID  . heparin  5,000 Units Subcutaneous Q8H  . ipratropium-albuterol  3 mL Nebulization Q6H  . mirtazapine  7.5 mg Oral QHS  . pantoprazole  40 mg Oral Daily  . QUEtiapine  12.5 mg Oral QHS    Continuous Infusions: . sodium chloride 30 mL/hr at 10/01/17 1600  . piperacillin-tazobactam (ZOSYN)  IV 3.375 g (10/02/17 1191)    PRN  Meds: acetaminophen **OR** acetaminophen, bisacodyl, HYDROcodone-acetaminophen, ondansetron **OR** ondansetron (ZOFRAN) IV, traZODone  Physical Exam  Pulmonary/Chest:  Productive cough.  Neurological: He is alert.  Skin: Skin is warm and dry.            Vital Signs: BP (!) 149/75 (BP Location: Left Arm)   Pulse 71   Temp (!) 97.4 F (36.3 C) (Oral)   Resp 19   Ht 6' (1.829 m)   Wt 58.8 kg   SpO2 93%   BMI 17.59 kg/m  SpO2: SpO2: 93 % O2 Device: O2 Device: Room Air O2 Flow Rate: O2 Flow Rate (L/min): 2 L/min  Intake/output summary:   Intake/Output Summary (Last 24 hours) at 10/02/2017 1340 Last data filed at 10/02/2017 0300 Gross per 24 hour  Intake 842.45 ml  Output -  Net 842.45 ml   LBM: Last BM Date: (uta) Baseline Weight: Weight: 63 kg Most recent weight: Weight: (weight not accurate due to bed)       Palliative Assessment/Data:  30%    Flowsheet Rows     Most Recent Value  Intake Tab  Referral Department  Hospitalist  Unit at Time of Referral  Med/Surg Unit  Palliative Care Primary Diagnosis  Sepsis/Infectious Disease  Date Notified  10/01/17  Palliative Care Type  New Palliative care  Reason for referral  Clarify Goals of Care, Non-pain Symptom  Date of Admission  09/30/17  Date first seen by Palliative Care  10/01/17  # of days Palliative referral response time  0 Day(s)  # of days IP prior to Palliative referral  1    Clinical Assessment  Psychosocial & Spiritual Assessment  Palliative Care Outcomes      Patient Active Problem List   Diagnosis Date Noted  . Protein-calorie malnutrition, severe 10/02/2017  . Pressure injury of skin 10/01/2017  . Altered mental status 01/04/2017  . Right upper lobe pneumonia (HCC) 01/08/2015  . Emphysema lung (HCC) 01/08/2015  . Leukocytosis 01/08/2015  . Anemia 01/08/2015  . Atrial fibrillation (HCC) 01/08/2015  . Dementia with behavioral disturbance (HCC) 01/08/2015  . Dysphagia 01/08/2015  . Sepsis (HCC) 01/06/2015    Palliative Care Assessment & Plan    Recommendations/Plan:  Treat the treatable.  DNR/DNI Recommend outpatient palliative to follow.   Code Status:    Code Status Orders  (From admission, onward)         Start     Ordered   09/30/17 1546  Do not attempt resuscitation (DNR)  Continuous    Question Answer Comment  In the event of cardiac or respiratory ARREST Do not call a "code blue"   In the event of cardiac or respiratory ARREST Do not perform Intubation, CPR, defibrillation or ACLS   In the event of cardiac or respiratory ARREST Use medication by any route, position, wound care, and other measures to relive pain and suffering. May use oxygen, suction and manual treatment of airway obstruction as needed for comfort.   Comments NMP      09/30/17 1545        Code Status History    Date Active Date Inactive Code Status Order ID Comments User Context   09/30/2017 1525 09/30/2017 1545 Full Code [id number]  Katha Hamming, MD ED   01/04/2017 1433 01/06/2017 1523 DNR [id number]  Houston Siren, MD ED   01/06/2015 2209 01/08/2015 1709 Full Code [id number]  Enid Baas, MD Inpatient    Advance Directive Documentation     Most Recent Value  Type of  Customer service manager Power of Attorney  Pre-existing out of facility DNR order (yellow form or pink MOST form)  Yellow form placed in chart (order not valid for inpatient  use)  "MOST" Form in Place?  -       Prognosis:  < 6 months  PNA. Dysphagia. Dementia. COPD Discharge Planning:  Return to facility with palliative   Thank you for allowing the Palliative Medicine Team to assist in the care of this patient.   Time In: 12:20 Time Out: 2:00 Total Time 1 hour 40 min Prolonged Time Billed yes      Greater than 50%  of this time was spent counseling and coordinating care related to the above assessment and plan.  Morton Stall, NP  Please contact Palliative Medicine Team phone at [phone] for questions and concerns.

## 2017-10-02 NOTE — Progress Notes (Signed)
Speech Language Pathology Treatment: Dysphagia  Patient Details Name: Donald Blanchard MRN: [id number] DOB: 02-26-1930 Today's Date: 10/02/2017 Time: 6213-0865 SLP Time Calculation (min) (ACUTE ONLY): 45 min  Assessment / Plan / Recommendation Clinical Impression  Pt seen for ongoing assessment of toleration of least restrictive oral diet; education w/ NSG staff as family/caregivers were not present at the time. Pt was asleep but was able to awake given verbal/tactile stim; noted eyes closed often necessitating verbal/tactile cues to realert him to the po task. Min mumbled speech; quite thick, Dysarthric. Noted tongue heavier to Left side.   Pt continues to appear to present w/ overt presentation of oropharyngeal phase dysphagia w/ increased risk for aspiration thus impact on the Pulmonary status; also risk for inability to sufficiently, and safely, meet his nutritional needs adequately as he exhibits fatigue, weakness, and closed eyes often during the meal - he is a dependent feeder as well. Pt does have the baseline of Lewy Body Dementia. Pt consumed po trials of Nectar consistency liquids via TSP fed by SLP w/ mild, delayed coughing noted x2/3 trials. This was not noted w/ trials of puree No overall decline in respiratory status post trials. However, d/t the suspected delay in pharyngeal swallowing and the multiple swallows to clear boluses(suspect pt could have pharyngeal residue post initial swallow which could be impacting), trials of Honey consistency liquids were given by TSP. Pt did not demonstrate a delayed cough w/ the trials given at that time. During the oral phase, pt exhibited prolonged oral phase time w/ po trials; noted decreased lingual control and coordination. Given time and min verbal/tactile cues when needed, he achieved A-P transfer and cleared boluses w/ no increased oral bolus residue noted. SLP gave min verbal/tactile cues frequently during the po trials to promote oral attention to  task/po's.  Much support was given to achieve a more mid-line head positioning for safer, comfortable swallowing. Pt was unable to follow through w/ instructions effectively. OM exam revealed overall weakness w/ open-mouth posture at rest and lingual lag to his Left. Due to pt's increased risk for aspiration from Dysphagia at this time in light of his baseline advanced Lewy Body Dementia, and new deficits w/ the Nectar liquids, recommend a Dysphagia level 1 w/ Honey consistency liquids diet w/ strict aspiration precautions; Pills in Puree - Crushed. Feeding Support at all meals; Supervision. NSG updated.      HPI HPI: Pt is a 84 y.o. male with a known history of advanced Alzheimer's Dementia, depression, atrial fibrillation, COPD brought in because of fever, shortness of breath. He has been having frequent falls as he has sciatic nerve pain that causes him to drag his leg. He uses a walker. He has been sleeping in his clothes, and recently was added to the facility list for assistance with dressing. He wears depends for bladder incontinence. He has been followed by SLP for dysphagia. His diet was alternated between a modified diet and a regular diet. Donald Blanchard states her father is proud, and does not like for his friends to see him eating a dysphagia diet as that is a sign of weakness. She also states their treatment team has suspected aspiration as the cause for pneumonia. Chest x-ray showed Increased LEFT basilar infiltrate question progressing of chronic interstitial lung disease versus superimposed pneumonia.       SLP Plan  Continue with current plan of care       Recommendations  Diet recommendations: Dysphagia 1 (puree);Honey-thick liquid Liquids provided via: Teaspoon Medication Administration:  Crushed with puree(as able to) Supervision: Staff to assist with self feeding;Full supervision/cueing for compensatory strategies Compensations: Minimize environmental distractions;Slow rate;Small  sips/bites;Lingual sweep for clearance of pocketing;Multiple dry swallows after each bite/sip;Follow solids with liquid Postural Changes and/or Swallow Maneuvers: Seated upright 90 degrees;Upright 30-60 min after meal                General recommendations: (Dietician f/u; Palliative Care f/u) Oral Care Recommendations: Oral care BID;Staff/trained caregiver to provide oral care Follow up Recommendations: Skilled Nursing facility(TBD) SLP Visit Diagnosis: Dysphagia, oropharyngeal phase (R13.12) Plan: Continue with current plan of care       GO                Jerilynn Som, MS, CCC-SLP Kiyah Demartini 10/02/2017, 1:25 PM

## 2017-10-02 NOTE — Progress Notes (Addendum)
Patient ID: Donald Blanchard, male   DOB: 05/01/1930, 84 y.o.   MRN: [id number]  [employer] PROGRESS NOTE  Jonmichael Beadnell OVF:[id number] DOB: April 13, 1930 DOA: 09/30/2017 PCP: Patient, No Pcp Per  HPI/Subjective: Patient answering questions.   Objective: Vitals:   10/02/17 0825 10/02/17 1315  BP: (!) 149/75   Pulse: 71   Resp: 19   Temp: (!) 97.4 F (36.3 C)   SpO2:  93%    Filed Weights   09/30/17 1410 09/30/17 1658  Weight: 63 kg 58.8 kg    ROS: Review of Systems  Constitutional: Negative for chills and fever.  Eyes: Negative for blurred vision.  Respiratory: Positive for cough and shortness of breath.   Cardiovascular: Negative for chest pain.  Gastrointestinal: Negative for abdominal pain, constipation, diarrhea, nausea and vomiting.  Genitourinary: Negative for dysuria.  Musculoskeletal: Positive for back pain. Negative for joint pain.  Neurological: Negative for dizziness and headaches.   Exam: Physical Exam  HENT:  Nose: No mucosal edema.  Mouth/Throat: No oropharyngeal exudate or posterior oropharyngeal edema.  Eyes: Pupils are equal, round, and reactive to light. Conjunctivae, EOM and lids are normal.  Neck: No JVD present. Carotid bruit is not present. No edema present. No thyroid mass and no thyromegaly present.  Cardiovascular: S1 normal and S2 normal. Exam reveals no gallop.  No murmur heard. Pulses:      Dorsalis pedis pulses are 2+ on the right side, and 2+ on the left side.  Respiratory: No respiratory distress. He has no wheezes. He has no rhonchi. He has no rales.  GI: Soft. Bowel sounds are normal. There is no tenderness.  Musculoskeletal:       Right ankle: He exhibits swelling.       Left ankle: He exhibits swelling.  Lymphadenopathy:    He has no cervical adenopathy.  Neurological: He is alert. No cranial nerve deficit.  Skin: Skin is warm. No rash noted. Nails show no clubbing.  Psychiatric: He has a normal mood and affect.      Data  Reviewed: Basic Metabolic Panel: Recent Labs  Lab 09/30/17 1413 10/01/17 0651 10/02/17 0339  NA 136 137 138  K 4.2 3.4* 3.6  CL 100 105 107  CO2 27 24 25   GLUCOSE 133* 108* 116*  BUN 24* 17 16  CREATININE 0.99 0.76 0.71  CALCIUM 9.3 8.1* 8.4*   Liver Function Tests: Recent Labs  Lab 09/30/17 1413  AST 30  ALT 11  ALKPHOS 98  BILITOT 1.6*  PROT 8.2*  ALBUMIN 3.8    CBC: Recent Labs  Lab 09/30/17 1413 10/01/17 0651 10/02/17 0339  WBC 37.2* 31.3* 21.5*  NEUTROABS 35.4*  --   --   HGB 12.3* 10.7* 11.0*  HCT 36.0* 31.2* 31.5*  MCV 97.9 97.8 97.0  PLT 280 232 227    CBG: Recent Labs  Lab 10/01/17 0742 10/02/17 0756  GLUCAP 89 103*    Recent Results (from the past 240 hour(s))  Blood Culture (routine x 2)     Status: None (Preliminary result)   Collection Time: 09/30/17  2:14 PM  Result Value Ref Range Status   Specimen Description BLOOD LEFT FOREARM  Final   Special Requests   Final    BOTTLES DRAWN AEROBIC AND ANAEROBIC Blood Culture results may not be optimal due to an excessive volume of blood received in culture bottles   Culture   Final    NO GROWTH 2 DAYS Performed at [facility], [address]  [place], [place] [address]    Report Status PENDING  Incomplete  Blood Culture (routine x 2)     Status: None (Preliminary result)   Collection Time: 09/30/17  2:20 PM  Result Value Ref Range Status   Specimen Description BLOOD RIGHT FA  Final   Special Requests   Final    BOTTLES DRAWN AEROBIC AND ANAEROBIC Blood Culture results may not be optimal due to an excessive volume of blood received in culture bottles   Culture   Final    NO GROWTH 2 DAYS Performed at [facility], [address], [place], [place] [address]    Report Status PENDING  Incomplete  MRSA PCR Screening     Status: None   Collection Time: 09/30/17  5:37 PM  Result Value Ref Range Status   MRSA by PCR NEGATIVE NEGATIVE Final    Comment:        The GeneXpert  MRSA Assay (FDA approved for NASAL specimens only), is one component of a comprehensive MRSA colonization surveillance program. It is not intended to diagnose MRSA infection nor to guide or monitor treatment for MRSA infections. Performed at [facility], [address] [place], [place] [address]      Studies: Dg Chest Portable 1 View  Result Date: 09/30/2017 CLINICAL DATA:  Increased weakness and falls over past weak, history dementia, atrial fibrillation, COPD, emphysema EXAM: PORTABLE CHEST 1 VIEW COMPARISON:  Portable exam 1431 hours compared to 01/04/2017 FINDINGS: Normal heart size, mediastinal contours, and pulmonary vascularity. Progressive scarring and volume loss at both apices greater on RIGHT. Progressive interstitial lung disease changes at LEFT base versus superimposed acute infiltrate. Stable chronic interstitial disease at RIGHT base. Underlying emphysematous changes. No pleural effusion, pneumothorax or acute osseous findings. IMPRESSION: COPD changes with biapical scarring RIGHT greater than LEFT. Increased LEFT basilar infiltrate question progressing of chronic interstitial lung disease versus superimposed pneumonia. Electronically Signed   By: Ulyses Southward M.D.   On: 09/30/2017 14:49    Scheduled Meds: . azithromycin  250 mg Oral Daily  . docusate sodium  100 mg Oral BID  . heparin  5,000 Units Subcutaneous Q8H  . ipratropium-albuterol  3 mL Nebulization Q6H  . mirtazapine  7.5 mg Oral QHS  . pantoprazole  40 mg Oral Daily  . QUEtiapine  12.5 mg Oral QHS   Continuous Infusions: . sodium chloride 30 mL/hr at 10/01/17 1600  . piperacillin-tazobactam (ZOSYN)  IV 3.375 g (10/02/17 2130)    Assessment/Plan:  1. Sepsis with fever leukocytosis and lactic acidosis.  Pneumonia on chest x-ray.  Zosyn and Zithromax.  Switch to Unasyn. continue IV fluids.  Wbc trending better. 2. Alzheimer's dementia.  On Seroquel at night 3. Weakness.  Physical therapy  evaluation 4. Depression on Remeron 5. COPD 6. History of paroxysmal atrial fibrillation 7. Swallow evaluation. On dysphagia diet with honey thikened liquid 8. Appreciate palliative care consultation 9. Severe malnutrition  Code Status:     Code Status Orders  (From admission, onward)         Start     Ordered   09/30/17 1546  Do not attempt resuscitation (DNR)  Continuous    Question Answer Comment  In the event of cardiac or respiratory ARREST Do not call a "code blue"   In the event of cardiac or respiratory ARREST Do not perform Intubation, CPR, defibrillation or ACLS   In the event of cardiac or respiratory ARREST Use medication by any route, position, wound care, and other  measures to relive pain and suffering. May use oxygen, suction and manual treatment of airway obstruction as needed for comfort.   Comments NMP      09/30/17 1545        Code Status History    Date Active Date Inactive Code Status Order ID Comments User Context   09/30/2017 1525 09/30/2017 1545 Full Code [id number]  Katha Hamming, MD ED   01/04/2017 1433 01/06/2017 1523 DNR [id number]  Houston Siren, MD ED   01/06/2015 2209 01/08/2015 1709 Full Code [id number]  Enid Baas, MD Inpatient    Advance Directive Documentation     Most Recent Value  Type of Advance Directive  Healthcare Power of Attorney  Pre-existing out of facility DNR order (yellow form or pink MOST form)  Yellow form placed in chart (order not valid for inpatient use)  "MOST" Form in Place?  -     Family Communication: Spoke with daughter on the phone today Disposition Plan: Hopefully back to facility with home health, depending on progress  Consultants:  Palliative care  Speech therapy  Antibiotics:  Zosyn-switch to Unasyn  Zithromax  Time spent: 27 minutes  Zaydrian Batta [employer]

## 2017-10-02 NOTE — Clinical Social Work Note (Signed)
Clinical Social Work Assessment  Patient Details  Name: Donald Blanchard MRN: [id number] Date of Birth: 1930/12/02  Date of referral:  10/02/17               Reason for consult:  Facility Placement                Permission sought to share information with:  Case Manager, Customer service manager, Family Supports Permission granted to share information::  Yes, Verbal Permission Granted  Name::      SNF  Agency::   [facility]  Relationship::     Contact Information:     Housing/Transportation Living arrangements for the past 2 months:  [place] of Information:  Patient Patient Interpreter Needed:  None Criminal Activity/Legal Involvement Pertinent to Current Situation/Hospitalization:  No - Comment as needed Significant Relationships:  Adult Children Lives with:  Facility Resident Do you feel safe going back to the place where you live?  Yes Need for family participation in patient care:  Yes (Comment)  Care giving concerns:  Patient is a long term resident at [facility] living.    Social Worker assessment / plan:  CSW met with patient to discuss discharge planning. Patient lives at [facility] CSW explained that PT is recommending SNF for rehab. Patient is agreeable to go to SNF if needed but wants to go back home. CSW will begin bed search in case patient needs SNF. Will continue to follow for discharge planning.   Employment status:  Retired Nurse, adult PT Recommendations:  [place] / Referral to community resources:  [place]  Patient/Family's Response to care:  Patient thanked CSW for assistance   Patient/Family's Understanding of and Emotional Response to Diagnosis, Current Treatment, and Prognosis:  Patient in agreement with DC plan   Emotional Assessment Appearance:  Appears stated age Attitude/Demeanor/Rapport:    Affect (typically observed):   Accepting, Restless, Guarded Orientation:  Oriented to Self, Oriented to Place Alcohol / Substance use:  Not Applicable Psych involvement (Current and /or in the community):  No (Comment)  Discharge Needs  Concerns to be addressed:  Discharge Planning Concerns Readmission within the last 30 days:  No Current discharge risk:  None Barriers to Discharge:  Continued Medical Work up   [employer], [place] 10/02/2017, 2:44 PM

## 2017-10-03 LAB — GLUCOSE, CAPILLARY: Glucose-Capillary: 85 mg/dL (ref 70–99)

## 2017-10-03 LAB — CBC
HCT: 31.8 % — ABNORMAL LOW (ref 40.0–52.0)
HEMOGLOBIN: 10.8 g/dL — AB (ref 13.0–18.0)
MCH: 32.9 pg (ref 26.0–34.0)
MCHC: 33.9 g/dL (ref 32.0–36.0)
MCV: 97 fL (ref 80.0–100.0)
Platelets: 302 10*3/uL (ref 150–440)
RBC: 3.28 MIL/uL — AB (ref 4.40–5.90)
RDW: 21.3 % — ABNORMAL HIGH (ref 11.5–14.5)
WBC: 12.1 10*3/uL — ABNORMAL HIGH (ref 3.8–10.6)

## 2017-10-03 MED ORDER — AMLODIPINE BESYLATE 5 MG PO TABS
2.5000 mg | ORAL_TABLET | Freq: Every day | ORAL | Status: DC
  Administered 2017-10-03 – 2017-10-04 (×2): 2.5 mg via ORAL
  Filled 2017-10-03 (×2): qty 1

## 2017-10-03 NOTE — Clinical Social Work Note (Signed)
Patient and daughter Berton Bon have requested that patient go back to his ALF facility. CSW spoke with Misty Stanley at [facility] regarding patient. Misty Stanley states that they can accept patient back and he can return when medically ready. CSW will continue to follow for discharge planning.   Ruthe Mannan MSW, [address] [phone]

## 2017-10-03 NOTE — Progress Notes (Signed)
Physical Therapy Treatment Patient Details Name: Donald Blanchard MRN: [id number] DOB: November 03, 1930 Today's Date: 10/03/2017    History of Present Illness Patient is an 84 year old male admitted from SNF with increased falls, increased weakness. Found to have sepsis and PNA.     PT Comments    Able to complete OOB to chair with RW this date, min/mod assist +2 for safety.  Poor balance in all standing activities, requiring hands-on assist at all times. Patient highly distractible, quick to release RW at times; absent balance reactions, very limited insight into deficits and need for assist appreciated.  Recommend strict use of RW, +1 hands-on assist and 24/7 sup/assist at discharge.    Follow Up Recommendations  SNF     Equipment Recommendations  None recommended by PT    Recommendations for Other Services       Precautions / Restrictions Precautions Precautions: Fall Restrictions Weight Bearing Restrictions: No    Mobility  Bed Mobility Overal bed mobility: Needs Assistance Bed Mobility: Supine to Sit     Supine to sit: Min assist;Mod assist     General bed mobility comments: assist for truncal elevation  Transfers Overall transfer level: Needs assistance Equipment used: Rolling walker (2 wheeled) Transfers: Sit to/from Stand Sit to Stand: Min assist;Mod assist;+2 safety/equipment            Ambulation/Gait Ambulation/Gait assistance: Min assist;Mod assist;+2 safety/equipment Gait Distance (Feet): 5 Feet Assistive device: Rolling walker (2 wheeled)       General Gait Details: shuffling steps, inconsistent gait performance.  Poor balance, all planes. L lateral lean/excessive weight shift due to significant scoliosis (with curvature towards R) and associated torticollis   Stairs             Wheelchair Mobility    Modified Rankin (Stroke Patients Only)       Balance Overall balance assessment: Needs assistance Sitting-balance support: Bilateral  upper extremity supported Sitting balance-Leahy Scale: Fair Sitting balance - Comments: requires assist until fully positioned at eob, requires close supervision due to cognitive limitations.    Standing balance support: Bilateral upper extremity supported Standing balance-Leahy Scale: Fair Standing balance comment: poor awareness of safety needs, often releasing RW in stance (absent balance control without UE support)                            Cognition Arousal/Alertness: Awake/alert Behavior During Therapy: WFL for tasks assessed/performed Overall Cognitive Status: No family/caregiver present to determine baseline cognitive functioning                                 General Comments: oriented to self only; follows simple commands.  Constant redirection to location, situation; absent carry-over.      Exercises Other Exercises Other Exercises: Upper body dressing (mod assist) and lower body dressing (mod/max assist) in unsupported sitting; poor awareness of limits of stability noted.  Sit/stand and standing balance with RW, min/mod assist +2; quickly releases grasp on RW to attend to clothing (absent awareness/insight into balance deficits)    General Comments        Pertinent Vitals/Pain Pain Assessment: Faces Faces Pain Scale: No hurt    Home Living                      Prior Function            PT Goals (  current goals can now be found in the care plan section) Acute Rehab PT Goals Patient Stated Goal: to return to facility PT Goal Formulation: Patient unable to participate in goal setting Time For Goal Achievement: 10/15/17 Potential to Achieve Goals: Fair Progress towards PT goals: Progressing toward goals    Frequency    Min 2X/week      PT Plan Current plan remains appropriate    Co-evaluation              AM-PAC PT "6 Clicks" Daily Activity  Outcome Measure  Difficulty turning over in bed (including adjusting  bedclothes, sheets and blankets)?: Unable Difficulty moving from lying on back to sitting on the side of the bed? : Unable Difficulty sitting down on and standing up from a chair with arms (e.g., wheelchair, bedside commode, etc,.)?: Unable Help needed moving to and from a bed to chair (including a wheelchair)?: A Lot Help needed walking in hospital room?: A Lot Help needed climbing 3-5 steps with a railing? : Total 6 Click Score: 8    End of Session Equipment Utilized During Treatment: Gait belt Activity Tolerance: Patient tolerated treatment well Patient left: in bed;with bed alarm set Nurse Communication: Mobility status PT Visit Diagnosis: Other abnormalities of gait and mobility (R26.89);History of falling (Z91.81);Difficulty in walking, not elsewhere classified (R26.2);Muscle weakness (generalized) (M62.81);Repeated falls (R29.6);Unsteadiness on feet (R26.81)     Time: 6962-9528 PT Time Calculation (min) (ACUTE ONLY): 20 min  Charges:  $Therapeutic Activity: 8-22 mins                    Xiadani Damman H. Manson Passey, PT, DPT, NCS 10/03/17, 2:16 PM [phone]

## 2017-10-03 NOTE — Progress Notes (Signed)
Patient ID: Donald Blanchard, male   DOB: 10-11-1930, 84 y.o.   MRN: [id number]  [employer] PROGRESS NOTE  Kaeden Mester WUJ:[id number] DOB: 1930/09/27 DOA: 09/30/2017 PCP: Patient, No Pcp Per  HPI/Subjective: Patient more talkative this morning.  Actually made some jokes today.  Answers all questions.  Follows commands.  Objective: Vitals:   10/03/17 0322 10/03/17 0744  BP: (!) 172/78   Pulse: 75   Resp: 17   Temp: 98.1 F (36.7 C)   SpO2: 94% 94%    Filed Weights   09/30/17 1410 09/30/17 1658  Weight: 63 kg 58.8 kg    ROS: Review of Systems  Constitutional: Negative for chills and fever.  Eyes: Negative for blurred vision.  Respiratory: Positive for cough and shortness of breath.   Cardiovascular: Negative for chest pain.  Gastrointestinal: Negative for abdominal pain, constipation, diarrhea, nausea and vomiting.  Genitourinary: Negative for dysuria.  Musculoskeletal: Positive for back pain. Negative for joint pain.  Neurological: Negative for dizziness and headaches.   Exam: Physical Exam  HENT:  Nose: No mucosal edema.  Mouth/Throat: No oropharyngeal exudate or posterior oropharyngeal edema.  Eyes: Pupils are equal, round, and reactive to light. Conjunctivae, EOM and lids are normal.  Neck: No JVD present. Carotid bruit is not present. No edema present. No thyroid mass and no thyromegaly present.  Cardiovascular: S1 normal and S2 normal. Exam reveals no gallop.  No murmur heard. Pulses:      Dorsalis pedis pulses are 2+ on the right side, and 2+ on the left side.  Respiratory: No respiratory distress. He has no wheezes. He has no rhonchi. He has no rales.  GI: Soft. Bowel sounds are normal. There is no tenderness.  Musculoskeletal:       Right ankle: He exhibits swelling.       Left ankle: He exhibits swelling.  Lymphadenopathy:    He has no cervical adenopathy.  Neurological: He is alert. No cranial nerve deficit.  Skin: Skin is warm. No rash noted. Nails  show no clubbing.  Psychiatric: He has a normal mood and affect.      Data Reviewed: Basic Metabolic Panel: Recent Labs  Lab 09/30/17 1413 10/01/17 0651 10/02/17 0339  NA 136 137 138  K 4.2 3.4* 3.6  CL 100 105 107  CO2 27 24 25   GLUCOSE 133* 108* 116*  BUN 24* 17 16  CREATININE 0.99 0.76 0.71  CALCIUM 9.3 8.1* 8.4*   Liver Function Tests: Recent Labs  Lab 09/30/17 1413  AST 30  ALT 11  ALKPHOS 98  BILITOT 1.6*  PROT 8.2*  ALBUMIN 3.8    CBC: Recent Labs  Lab 09/30/17 1413 10/01/17 0651 10/02/17 0339 10/03/17 0719  WBC 37.2* 31.3* 21.5* 12.1*  NEUTROABS 35.4*  --   --   --   HGB 12.3* 10.7* 11.0* 10.8*  HCT 36.0* 31.2* 31.5* 31.8*  MCV 97.9 97.8 97.0 97.0  PLT 280 232 227 302    CBG: Recent Labs  Lab 10/01/17 0742 10/02/17 0756 10/03/17 0731  GLUCAP 89 103* 85    Recent Results (from the past 240 hour(s))  Blood Culture (routine x 2)     Status: None (Preliminary result)   Collection Time: 09/30/17  2:14 PM  Result Value Ref Range Status   Specimen Description BLOOD LEFT FOREARM  Final   Special Requests   Final    BOTTLES DRAWN AEROBIC AND ANAEROBIC Blood Culture results may not be optimal due to an excessive volume of blood  received in culture bottles   Culture   Final    NO GROWTH 3 DAYS Performed at [facility], [address] [place], [place] [address]    Report Status PENDING  Incomplete  Blood Culture (routine x 2)     Status: None (Preliminary result)   Collection Time: 09/30/17  2:20 PM  Result Value Ref Range Status   Specimen Description BLOOD RIGHT FA  Final   Special Requests   Final    BOTTLES DRAWN AEROBIC AND ANAEROBIC Blood Culture results may not be optimal due to an excessive volume of blood received in culture bottles   Culture   Final    NO GROWTH 3 DAYS Performed at [facility], [address], [place], [place] [address]    Report Status PENDING  Incomplete  MRSA PCR Screening     Status:  None   Collection Time: 09/30/17  5:37 PM  Result Value Ref Range Status   MRSA by PCR NEGATIVE NEGATIVE Final    Comment:        The GeneXpert MRSA Assay (FDA approved for NASAL specimens only), is one component of a comprehensive MRSA colonization surveillance program. It is not intended to diagnose MRSA infection nor to guide or monitor treatment for MRSA infections. Performed at [facility], [address] [place], [place] [address]       Scheduled Meds: . amLODipine  2.5 mg Oral Daily  . azithromycin  250 mg Oral Daily  . docusate sodium  100 mg Oral BID  . heparin  5,000 Units Subcutaneous Q8H  . ipratropium-albuterol  3 mL Nebulization Q6H  . mirtazapine  7.5 mg Oral QHS  . pantoprazole  40 mg Oral Daily  . QUEtiapine  12.5 mg Oral QHS   Continuous Infusions: . ampicillin-sulbactam (UNASYN) IV 3 g (10/03/17 0828)    Assessment/Plan:  1. Sepsis with fever leukocytosis and lactic acidosis.  Pneumonia on chest x-ray.  Switch to Unasyn. continue IV fluids.  Wbc almost back to normal range.  Likely discharge back to his assisted living tomorrow with home health. 2. Alzheimer's dementia.  On Seroquel at night 3. Weakness.  Physical therapy evaluation 4. Depression on Remeron 5. COPD 6. History of paroxysmal atrial fibrillation 7. Swallow evaluation. On dysphagia diet with honey thikened liquid 8. Appreciate palliative care consultation 9. Severe malnutrition  Code Status:     Code Status Orders  (From admission, onward)         Start     Ordered   09/30/17 1546  Do not attempt resuscitation (DNR)  Continuous    Question Answer Comment  In the event of cardiac or respiratory ARREST Do not call a "code blue"   In the event of cardiac or respiratory ARREST Do not perform Intubation, CPR, defibrillation or ACLS   In the event of cardiac or respiratory ARREST Use medication by any route, position, wound care, and other measures to relive pain and  suffering. May use oxygen, suction and manual treatment of airway obstruction as needed for comfort.   Comments NMP      09/30/17 1545        Code Status History    Date Active Date Inactive Code Status Order ID Comments User Context   09/30/2017 1525 09/30/2017 1545 Full Code [id number]  Katha Hamming, MD ED   01/04/2017 1433 01/06/2017 1523 DNR [id number]  Houston Siren, MD ED   01/06/2015 2209 01/08/2015 1709 Full Code [id number]  Enid Baas,  MD Inpatient    Advance Directive Documentation     Most Recent Value  Type of Advance Directive  Healthcare Power of Attorney  Pre-existing out of facility DNR order (yellow form or pink MOST form)  Yellow form placed in chart (order not valid for inpatient use)  "MOST" Form in Place?  -     Family Communication: Spoke with daughter on the phone today Disposition Plan: Hopefully back to facility with home health tomorrow  Consultants:  Palliative care  Speech therapy  Antibiotics:  Unasyn  Zithromax  Time spent: 28 minutes  Cortana Vanderford [employer]

## 2017-10-04 LAB — GLUCOSE, CAPILLARY: GLUCOSE-CAPILLARY: 88 mg/dL (ref 70–99)

## 2017-10-04 MED ORDER — AMLODIPINE BESYLATE 2.5 MG PO TABS: 2.5000 mg | ORAL_TABLET | Freq: Every day | ORAL | 0 refills | Status: AC

## 2017-10-04 MED ORDER — AMOXICILLIN-POT CLAVULANATE 875-125 MG PO TABS: 1.0000 | ORAL_TABLET | Freq: Two times a day (BID) | ORAL | 0 refills | Status: AC

## 2017-10-04 MED ORDER — IPRATROPIUM-ALBUTEROL 0.5-2.5 (3) MG/3ML IN SOLN: 3.0000 mL | Freq: Three times a day (TID) | RESPIRATORY_TRACT | Status: DC

## 2017-10-04 MED ORDER — FLUCONAZOLE 100 MG PO TABS: 100.0000 mg | ORAL_TABLET | Freq: Every day | ORAL | 0 refills | Status: DC

## 2017-10-04 MED ORDER — FLUCONAZOLE 100 MG PO TABS
200.0000 mg | ORAL_TABLET | Freq: Once | ORAL | Status: AC
  Administered 2017-10-04: 200 mg via ORAL
  Filled 2017-10-04: qty 2

## 2017-10-04 MED ORDER — FLUCONAZOLE 100 MG PO TABS: 100.0000 mg | ORAL_TABLET | Freq: Every day | ORAL | Status: DC

## 2017-10-04 MED ORDER — GUAIFENESIN ER 600 MG PO TB12: 600.0000 mg | ORAL_TABLET | Freq: Two times a day (BID) | ORAL | 0 refills | Status: AC

## 2017-10-04 MED ORDER — ALBUTEROL SULFATE (2.5 MG/3ML) 0.083% IN NEBU: 2.5000 mg | INHALATION_SOLUTION | Freq: Four times a day (QID) | RESPIRATORY_TRACT | 0 refills | Status: DC

## 2017-10-04 MED ORDER — ALBUTEROL SULFATE (2.5 MG/3ML) 0.083% IN NEBU: 2.5000 mg | INHALATION_SOLUTION | RESPIRATORY_TRACT | Status: DC | PRN

## 2017-10-04 NOTE — Care Management (Signed)
Discharge to home today per Dr. Renae Gloss. Will continue to be followed by [facility]. [facility] provides occupational therapy, physical therapy, skilled nursing, social worker, and speech. Will get nebulizer for home uise. Gwenette Greet RN MSN CCM Care Management [phone]

## 2017-10-04 NOTE — Care Management Important Message (Signed)
Important Message  Patient Details  Name: Donald Blanchard MRN: [id number] Date of Birth: 07-16-1930   Medicare Important Message Given:  Other (see comment) Talked with daughter, Berton Bon and she is POA and in agreement with discharge but to have her dad to try and place a mark on the form.  Visited patient and he was feeling sick and cold so not able to obtain a signature.   Olegario Messier A Juvencio Verdi 10/04/2017, 12:26 PM

## 2017-10-04 NOTE — Progress Notes (Signed)
New referral for palliative care at [facility].  Information faxed to referral intake.  Donald Blanchard

## 2017-10-04 NOTE — Progress Notes (Addendum)
Patient is medically stable for D/C back to [facility] today. Per Misty Stanley resident care coordinator at [facility] patient can return today and resume services with [facility]. Misty Stanley requested speech therapy to be added to home health. RN case manager aware of above and arranged home health and nebulizer. RN will call report and Chip Boer will provide transport. Clinical Child psychotherapist (CSW) sent D/C summary and FL2 to [facility]. CSW contacted patient's daughter Aram Beecham and made him aware of above. Palliative to follow outpatient. [facility] liaison is aware of above. Please reconsult if future social work needs arise. CSW signing off.   [employer], LCSW [phone]

## 2017-10-04 NOTE — NC FL2 (Signed)
[place] MEDICAID FL2 LEVEL OF CARE SCREENING TOOL     IDENTIFICATION  Patient Name: Donald Blanchard Birthdate: 07-26-1930 Sex: male Admission Date (Current Location): 09/30/2017  [place] and [place] Number:  Chiropodist and Address:  [facility], [address] [place], [place] [address]      Provider Number: [id number]  Attending Physician Name and Address:  Alford Highland, MD  Relative Name and Phone Number:  Berton Bon- daughter [phone]    Current Level of Care: Hospital Recommended Level of Care: Assisted Living Facility Prior Approval Number:    Date Approved/Denied:   PASRR Number: (RSVP # [id number])  Discharge Plan: Domiciliary (Rest home)    Current Diagnoses: Patient Active Problem List   Diagnosis Date Noted  . Protein-calorie malnutrition, severe 10/02/2017  . Pressure injury of skin 10/01/2017  . Altered mental status 01/04/2017  . Right upper lobe pneumonia (HCC) 01/08/2015  . Emphysema lung (HCC) 01/08/2015  . Leukocytosis 01/08/2015  . Anemia 01/08/2015  . Atrial fibrillation (HCC) 01/08/2015  . Dementia with behavioral disturbance (HCC) 01/08/2015  . Dysphagia 01/08/2015  . Sepsis (HCC) 01/06/2015    Orientation RESPIRATION BLADDER Height & Weight     Self, Time, Place  Normal Incontinent Weight: 137 lb (62.1 kg)(weight not accurate due to low bed) Height:  6' (182.9 cm)  BEHAVIORAL SYMPTOMS/MOOD NEUROLOGICAL BOWEL NUTRITION STATUS  (none) (none) Incontinent Diet(Diet: Dysphagia 1 (puree);Honey-thick liquid)  AMBULATORY STATUS COMMUNICATION OF NEEDS Skin   Limited Assist Verbally PU Stage and Appropriate Care(pressure ulcer stage 1 on coccyx. )                       Personal Care Assistance Level of Assistance  Bathing, Dressing, Feeding Bathing Assistance: Limited assistance Feeding assistance: Independent Dressing Assistance: Limited assistance     Functional Limitations Info  Sight,  Hearing, Speech Sight Info: Adequate Hearing Info: Adequate Speech Info: Adequate    SPECIAL CARE FACTORS FREQUENCY  PT (By licensed PT), OT (By licensed OT)(Home Health PT, OT, Nursing, and SLP. )   Outpatient Palliative to follow      PT Frequency: (2-3) OT Frequency: (2-3)            Contractures Contractures Info: Not present    Additional Factors Info  Code Status, Allergies Code Status Info: (DNR ) Allergies Info: (No Known Allergies. )          Discharge Medications: Please see discharge summary for a list of discharge medications. Medication List        STOP taking these medications       QUEtiapine 25 MG tablet Commonly known as:  SEROQUEL             TAKE these medications       acetaminophen 325 MG tablet Commonly known as:  TYLENOL Take 650 mg by mouth every 6 (six) hours as needed for mild pain, moderate pain or fever.   albuterol (2.5 MG/3ML) 0.083% nebulizer solution Commonly known as:  PROVENTIL Take 3 mLs (2.5 mg total) by nebulization every 6 (six) hours. Dx copd   amLODipine 2.5 MG tablet Commonly known as:  NORVASC Take 1 tablet (2.5 mg total) by mouth daily. Start taking on:  10/05/2017   amoxicillin-clavulanate 875-125 MG tablet Commonly known as:  AUGMENTIN Take 1 tablet by mouth 2 (two) times daily for 4 days.   fluconazole 100 MG tablet Commonly known as:  DIFLUCAN Take 1 tablet (100 mg total) by  mouth daily. Start taking on:  10/05/2017   guaiFENesin 600 MG 12 hr tablet Commonly known as:  MUCINEX Take 1 tablet (600 mg total) by mouth 2 (two) times daily for 7 days.   meloxicam 7.5 MG tablet Commonly known as:  MOBIC Take 7.5 mg by mouth daily.   mirtazapine 7.5 MG tablet Commonly known as:  REMERON Take 7.5 mg by mouth at bedtime.   multivitamin with minerals Tabs tablet Take 1 tablet by mouth daily.   omeprazole 20 MG capsule Commonly known as:  PRILOSEC Take 20 mg by mouth daily.   tiotropium  18 MCG inhalation capsule Commonly known as:  SPIRIVA Place 18 mcg into inhaler and inhale daily.   traMADol 50 MG tablet Commonly known as:  ULTRAM Take 50 mg by mouth 2 (two) times daily.   Relevant Imaging Results: Relevant Lab Results: Additional Information (SSN: [id number])  Nikolette Reindl, Darleen Crocker, LCSW

## 2017-10-04 NOTE — Discharge Summary (Signed)
[employer] [facility] [place] [facility]   PATIENT NAME: Donald Blanchard    MR#:  [id number]  DATE OF BIRTH:  1930/11/03  DATE OF ADMISSION:  09/30/2017 ADMITTING PHYSICIAN: Katha Hamming, MD  DATE OF DISCHARGE: 10/04/2017  PRIMARY CARE PHYSICIAN: [facility] house [facility].   ADMISSION DIAGNOSIS:  Sepsis, due to unspecified organism [A41.9] Community acquired pneumonia, unspecified laterality [J18.9]  DISCHARGE DIAGNOSIS:  Active Problems:   Sepsis (HCC)   Pressure injury of skin   Protein-calorie malnutrition, severe   SECONDARY DIAGNOSIS:   Past Medical History:  Diagnosis Date  . A-fib (HCC)   . COPD (chronic obstructive pulmonary disease) (HCC)    not on home o2  . Emphysema of lung (HCC)   . Sciatica     HOSPITAL COURSE:   1.  Clinical sepsis with aspiration pneumonia.  Patient initially placed on aggressive antibiotics and switched over to Unasyn.  We will switch over to Augmentin upon getting out of the hospital. 2.  Thrush.  Start Diflucan. 3.  Weakness.  Physical therapy recommended rehab but patient and family want to go back to facility with home with home health.  Patient will likely need more care at this point. 4.  Depression on Remeron 5.  History of COPD 6.  History of paroxysmal atrial fibrillation 7.  Nutrition.  Patient is on dysphagia 1 diet with honey thickened liquid.  Recommend speech therapy following as outpatient.  Patient and family would not want a feeding tube if it came to that.  Patient is high risk for aspiration repeated aspiration events. 8.  Recommend palliative care following his outpatient. 9.  Severe malnutrition 10.  Hypertension on low-dose Norvasc 11.  Patient needs assistance with feeding, ambulation, activities of daily living including dressing and bathing. 12.  Recommend podiatry consultation to cut toenails as outpatient  DISCHARGE CONDITIONS:   Fair  CONSULTS OBTAINED:  Palliative care  DRUG  ALLERGIES:  No Known Allergies  DISCHARGE MEDICATIONS:   Allergies as of 10/04/2017   No Known Allergies     Medication List    STOP taking these medications   QUEtiapine 25 MG tablet Commonly known as:  SEROQUEL     TAKE these medications   acetaminophen 325 MG tablet Commonly known as:  TYLENOL Take 650 mg by mouth every 6 (six) hours as needed for mild pain, moderate pain or fever.   albuterol (2.5 MG/3ML) 0.083% nebulizer solution Commonly known as:  PROVENTIL Take 3 mLs (2.5 mg total) by nebulization every 6 (six) hours. Dx copd   amLODipine 2.5 MG tablet Commonly known as:  NORVASC Take 1 tablet (2.5 mg total) by mouth daily. Start taking on:  10/05/2017   amoxicillin-clavulanate 875-125 MG tablet Commonly known as:  AUGMENTIN Take 1 tablet by mouth 2 (two) times daily for 4 days.   fluconazole 100 MG tablet Commonly known as:  DIFLUCAN Take 1 tablet (100 mg total) by mouth daily. Start taking on:  10/05/2017   guaiFENesin 600 MG 12 hr tablet Commonly known as:  MUCINEX Take 1 tablet (600 mg total) by mouth 2 (two) times daily for 7 days.   meloxicam 7.5 MG tablet Commonly known as:  MOBIC Take 7.5 mg by mouth daily.   mirtazapine 7.5 MG tablet Commonly known as:  REMERON Take 7.5 mg by mouth at bedtime.   multivitamin with minerals Tabs tablet Take 1 tablet by mouth daily.   omeprazole 20 MG capsule Commonly known as:  PRILOSEC Take 20 mg  by mouth daily.   tiotropium 18 MCG inhalation capsule Commonly known as:  SPIRIVA Place 18 mcg into inhaler and inhale daily.   traMADol 50 MG tablet Commonly known as:  ULTRAM Take 50 mg by mouth 2 (two) times daily.            Durable Medical Equipment  (From admission, onward)         Start     Ordered   10/04/17 0905  For home use only DME Nebulizer machine  Once    Question:  Patient needs a nebulizer to treat with the following condition  Answer:  COPD (chronic obstructive pulmonary disease)  (HCC)   10/04/17 0905           DISCHARGE INSTRUCTIONS:  Follow-up with doctors making house calls 5 days  If you experience worsening of your admission symptoms, develop shortness of breath, life threatening emergency, suicidal or homicidal thoughts you must seek medical attention immediately by calling 911 or calling your MD immediately  if symptoms less severe.  You Must read complete instructions/literature along with all the possible adverse reactions/side effects for all the Medicines you take and that have been prescribed to you. Take any new Medicines after you have completely understood and accept all the possible adverse reactions/side effects.   Please note  You were cared for by a hospitalist during your hospital stay. If you have any questions about your discharge medications or the care you received while you were in the hospital after you are discharged, you can call the unit and asked to speak with the hospitalist on call if the hospitalist that took care of you is not available. Once you are discharged, your primary care physician will handle any further medical issues. Please note that NO REFILLS for any discharge medications will be authorized once you are discharged, as it is imperative that you return to your primary care physician (or establish a relationship with a primary care physician if you do not have one) for your aftercare needs so that they can reassess your need for medications and monitor your lab values.    Today   CHIEF COMPLAINT:   Chief Complaint  Patient presents with  . Weakness  . Fall    HISTORY OF PRESENT ILLNESS:  Donald Blanchard  is a 84 y.o. male with a known history of aspiration for pneumonia presents with weakness and fall and found to have aspiration pneumonia   VITAL SIGNS:  Blood pressure (!) 152/96, pulse 79, temperature 98 F (36.7 C), temperature source Oral, resp. rate 20, height 6' (1.829 m), weight 62.1 kg, SpO2 95  %.   PHYSICAL EXAMINATION:  GENERAL:  84 y.o.-year-old patient lying in the bed with no acute distress.  EYES: Pupils equal, round, reactive to light and accommodation. No scleral icterus. Extraocular muscles intact.  HEENT: Head atraumatic, normocephalic. Oropharynx and nasopharynx clear.  Possible thrush on tongue NECK:  Supple, no jugular venous distention. No thyroid enlargement, no tenderness.  LUNGS: Normal breath sounds bilaterally, no wheezing, bilateral base rhonchi. No use of accessory muscles of respiration.  CARDIOVASCULAR: S1, S2 normal. No murmurs, rubs, or gallops.  ABDOMEN: Soft, non-tender, non-distended. Bowel sounds present. No organomegaly or mass.  EXTREMITIES: No pedal edema, cyanosis, or clubbing.  NEUROLOGIC: Cranial nerves II through XII are intact. Muscle strength 5/5 in all extremities. Sensation intact. Gait not checked.  PSYCHIATRIC: The patient is alert and answers questions.  SKIN: Skin tears lower extremity  DATA REVIEW:  CBC Recent Labs  Lab 10/03/17 0719  WBC 12.1*  HGB 10.8*  HCT 31.8*  PLT 302    Chemistries  Recent Labs  Lab 09/30/17 1413  10/02/17 0339  NA 136   < > 138  K 4.2   < > 3.6  CL 100   < > 107  CO2 27   < > 25  GLUCOSE 133*   < > 116*  BUN 24*   < > 16  CREATININE 0.99   < > 0.71  CALCIUM 9.3   < > 8.4*  AST 30  --   --   ALT 11  --   --   ALKPHOS 98  --   --   BILITOT 1.6*  --   --    < > = values in this interval not displayed.    Microbiology Results  Results for orders placed or performed during the hospital encounter of 09/30/17  Blood Culture (routine x 2)     Status: None (Preliminary result)   Collection Time: 09/30/17  2:14 PM  Result Value Ref Range Status   Specimen Description BLOOD LEFT FOREARM  Final   Special Requests   Final    BOTTLES DRAWN AEROBIC AND ANAEROBIC Blood Culture results may not be optimal due to an excessive volume of blood received in culture bottles   Culture   Final    NO  GROWTH 4 DAYS Performed at [facility], [address], [place], [place] [address]    Report Status PENDING  Incomplete  Blood Culture (routine x 2)     Status: None (Preliminary result)   Collection Time: 09/30/17  2:20 PM  Result Value Ref Range Status   Specimen Description BLOOD RIGHT FA  Final   Special Requests   Final    BOTTLES DRAWN AEROBIC AND ANAEROBIC Blood Culture results may not be optimal due to an excessive volume of blood received in culture bottles   Culture   Final    NO GROWTH 4 DAYS Performed at [facility], [address], [place], [place] [address]    Report Status PENDING  Incomplete  MRSA PCR Screening     Status: None   Collection Time: 09/30/17  5:37 PM  Result Value Ref Range Status   MRSA by PCR NEGATIVE NEGATIVE Final    Comment:        The GeneXpert MRSA Assay (FDA approved for NASAL specimens only), is one component of a comprehensive MRSA colonization surveillance program. It is not intended to diagnose MRSA infection nor to guide or monitor treatment for MRSA infections. Performed at [facility], [address], [place], [place] [address]       Management plans discussed with the patient, family and they are in agreement.  CODE STATUS:     Code Status Orders  (From admission, onward)         Start     Ordered   09/30/17 1546  Do not attempt resuscitation (DNR)  Continuous    Question Answer Comment  In the event of cardiac or respiratory ARREST Do not call a "code blue"   In the event of cardiac or respiratory ARREST Do not perform Intubation, CPR, defibrillation or ACLS   In the event of cardiac or respiratory ARREST Use medication by any route, position, wound care, and other measures to relive pain and suffering. May use oxygen, suction and manual treatment of airway obstruction as needed for comfort.  Comments NMP      09/30/17 1545        Code Status History    Date Active Date Inactive  Code Status Order ID Comments User Context   09/30/2017 1525 09/30/2017 1545 Full Code [id number]  Katha Hamming, MD ED   01/04/2017 1433 01/06/2017 1523 DNR [id number]  Houston Siren, MD ED   01/06/2015 2209 01/08/2015 1709 Full Code [id number]  Enid Baas, MD Inpatient    Advance Directive Documentation     Most Recent Value  Type of Advance Directive  Healthcare Power of Attorney  Pre-existing out of facility DNR order (yellow form or pink MOST form)  Yellow form placed in chart (order not valid for inpatient use)  "MOST" Form in Place?  -      TOTAL TIME TAKING CARE OF THIS PATIENT: 35 minutes.    Alford Highland M.D on 10/04/2017 at 9:05 AM  Between 7am to 6pm - Pager - [phone]  After 6pm go to [email or web address] - password [email or web address] [facility]  [employer] Office  [phone]  CC: Primary care physician; doctors making house calls

## 2017-10-05 LAB — CULTURE, BLOOD (ROUTINE X 2)
Culture: NO GROWTH
Culture: NO GROWTH

## 2017-11-06 NOTE — Progress Notes (Signed)
11/06/17: Clinical Social Worker (CSW) faxed patient's FL2 to Alfred Levins with [employer] for the RSVP referral as requested.   [employer], LCSW [phone]

## 2017-12-10 ENCOUNTER — Non-Acute Institutional Stay: Payer: Self-pay | Admitting: Nurse Practitioner

## 2017-12-10 VITALS — Ht 71.0 in | Wt 127.0 lb

## 2017-12-10 DIAGNOSIS — R2681 Unsteadiness on feet: Secondary | ICD-10-CM

## 2017-12-10 DIAGNOSIS — R630 Anorexia: Secondary | ICD-10-CM | POA: Insufficient documentation

## 2017-12-10 DIAGNOSIS — Z515 Encounter for palliative care: Secondary | ICD-10-CM

## 2017-12-10 DIAGNOSIS — R413 Other amnesia: Secondary | ICD-10-CM

## 2017-12-10 NOTE — Progress Notes (Signed)
[facility] Telephone: [phone] Fax: [phone]  PATIENT NAME: Donald Blanchard DOB: Sep 03, 1930 MRN: [id number]  PRIMARY CARE PROVIDER:   [facility] REFERRING PROVIDER:  [facility] [place]  RESPONSIBLE Blanchard:   Donald Blanchard daughter health care power-of-attorney [phone] or [phone]  ASSESSMENT:    I visited and observed Donald Blanchard. Talked about purpose of palliative medicine visit. Asked if he was having symptoms of pain and he replied no as he currently is on tramadol with mobic which has been effective pain relief regiment. Asked if he was having symptoms of shortness of breath and he replied no. We talked about his appetite which has been declined. Donald Blanchard endorses that his appetite he feels is doing okay. We talked about his mobility and getting up out of bed as currently he is lying in bed. He talked about getting up to go to the dining area to eat lunch. We talked about is functional level. He talked about his walker and how he ambulates. He talked about resting in between. We talked about going to activities as currently Christmas carols are being sung. We talked about role of palliative medicine and plan of care. It was much more difficult understanding his speech today as previous palliative medicine visit. Cognitively he appears to be slowly declining as well as functionally with overall generalized weakness. DNR already in place. Emotional support provided. I discuss with staff, will order a complete metabolic panel to evaluate albumin and total protein for protein calorie malnutrition. I called and left a message for his daughter Donald Blanchard for update. May consider revisiting hospice screening with weight loss this visit, decrease appetite, overall functional with cognitive decline but will wait until Labs resulted to be determined.     RECOMMENDATIONS and PLAN:   1.Memory loss R41.3 secondary to dementia. Continue with supportive measures as chronic disease remains  Progressive.  2. Unsteady gait R26.81 secondary to progression of Dementia disease. Continue fall precautions, high-risk. Encourage therapy for balance training. Reinforced with staff importance of call bell. Encouraged Donald Blanchard not to get up on his own, encourage staff frequent reminders.  3. Anorexia R63.0 secondary to protein calorie malnutrition improving reflective of weight loss. Continue to monitor daily weights, supplements, supportive measures and encourage to eat; CMET drawn for albumin/total protein  4.  Palliative care encounter Z51.5; Ongoing monitoring chronic disease management, symptoms, goc with emotional support.  I spent 60 minutes providing this consultation,  from 11:00am to 12:00pm. More than 50% of the time in this consultation was spent coordinating communication.   HISTORY OF PRESENT ILLNESS:  Donald Blanchard is a 84 y.o. 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 Last hospitalization 9 / 30 / 2019 / sepsis secondary to aspiration pneumonia improve with antibiotic therapy and discharge back to Assisted Living where he currently resides. He is ambulatory with a walker only a few steps at a time and transferring. Last palliative medicine visit 10/31 / 2019 for weakness and decreased appetite. [facility] [employer] wish for hospice screening which was completed on 11 / 21 / 2019 and found not eligible with no significant weight loss an eating 90% to 100% of his meals. He does require assistance with adl's. He does go to the dining area by wheelchair for his meals. Staff endorses he is eating less than 50% now and has demonstrated a weight loss. No  recent Falls, hospitalizations since 9 / 30 / 2019, infections, wounds. Staff endorses they see an overall Decline and speech is  becoming more difficult to understand. At present he is lying in bed. He appears comfortable. No visitors present. Palliative Care was asked to help address goals of care.   CODE STATUS: DNR  PPS: 40% HOSPICE ELIGIBILITY/DIAGNOSIS: possible <6 months with clinical presentation  PAST MEDICAL HISTORY:  Past Medical History:  Diagnosis Date  . A-fib (HCC)   . COPD (chronic obstructive pulmonary disease) (HCC)    not on home o2  . Emphysema of lung (HCC)   . Sciatica     SOCIAL HX:  Social History   Tobacco Use  . Smoking status: Former Smoker    Types: Cigarettes    Last attempt to quit: 01/02/2004    Years since quitting: 13.9  . Smokeless tobacco: Never Used  . Tobacco comment: Quit 10 years ago  Substance Use Topics  . Alcohol use: No    Alcohol/week: 0.0 standard drinks    ALLERGIES: No Known Allergies   PERTINENT MEDICATIONS:  Outpatient Encounter Medications as of 12/10/2017  Medication Sig  . acetaminophen (TYLENOL) 325 MG tablet Take 650 mg by mouth every 6 (six) hours as needed for mild pain, moderate pain or fever.   Marland Kitchen. albuterol (PROVENTIL) (2.5 MG/3ML) 0.083% nebulizer solution Take 3 mLs (2.5 mg total) by nebulization every 6 (six) hours. Dx copd  . amLODipine (NORVASC) 2.5 MG tablet Take 1 tablet (2.5 mg total) by mouth daily.  . fluconazole (DIFLUCAN) 100 MG tablet Take 1 tablet (100 mg total) by mouth daily.  . meloxicam (MOBIC) 7.5 MG tablet Take 7.5 mg by mouth daily.  . mirtazapine (REMERON) 7.5 MG tablet Take 7.5 mg by mouth at bedtime.  . Multiple Vitamin (MULTIVITAMIN WITH MINERALS) TABS tablet Take 1 tablet by mouth daily.  Marland Kitchen. omeprazole (PRILOSEC) 20 MG capsule Take 20 mg by mouth daily.  Marland Kitchen. tiotropium (SPIRIVA) 18 MCG inhalation capsule Place 18 mcg into inhaler and inhale daily.  . traMADol (ULTRAM) 50 MG tablet Take 50 mg by mouth 2 (two) times daily.   No facility-administered encounter medications on file as of 12/10/2017.     PHYSICAL EXAM:    General: NAD, frail appearing, thin pleasant male Cardiovascular: regular rate and rhythm Pulmonary: clear ant fields Abdomen: soft, nontender, + bowel sounds GU: no suprapubic tenderness Extremities: Decrease muscle tone BLE Skin: pale Neurological: generalized weakness, unsteady gait  Levita Monical Z Piotr Christopher, NP

## 2017-12-11 ENCOUNTER — Encounter: Payer: Self-pay | Admitting: Nurse Practitioner

## 2017-12-12 ENCOUNTER — Telehealth: Payer: Self-pay | Admitting: Nurse Practitioner

## 2017-12-12 NOTE — Telephone Encounter (Signed)
Donald Blanchard, Donald Blanchard send return call. What about purpose for palliative medicine visit. Talked about palliative medicine with Donald Blanchard. Clinical update discuss. We talked about hospice screening that found Donald Blanchard not to be eligible at that time. We talked about since then he appears to have had a weight loss and decreased appetite. We talked about staff reporting eating less than 50%. Donald BeechamCynthia endorses that she does see some decline but he seems about the same. We talked about lab set were recommended to be ordered to evaluate for protein calorie malnutrition and the setting of weight loss. We talked about seeing what the results are and may be able to revisit hospice screening if albumin is low. Talk about role of palliative medicine and plan of care. She talked about her mom being in locked memory care unit with dementia and on going decline. Therapeutic listening and emotional support provided. Discuss that will follow up with labs and repeat way in 2 weeks or sooner should he declined. Donald BeechamCynthia in agreement and in agreement to palliative medicine visit.  Total time spent 30 minutes  Documentation 10 minutes Phone discussion 20 minutes

## 2017-12-17 NOTE — Telephone Encounter (Signed)
This encounter was created in error - please disregard.

## 2018-02-04 ENCOUNTER — Non-Acute Institutional Stay: Payer: Medicare Other | Admitting: Nurse Practitioner

## 2018-02-04 ENCOUNTER — Encounter: Payer: Self-pay | Admitting: Nurse Practitioner

## 2018-02-04 VITALS — HR 82 | Temp 97.0°F | Resp 18

## 2018-02-04 DIAGNOSIS — Z515 Encounter for palliative care: Secondary | ICD-10-CM

## 2018-02-04 DIAGNOSIS — R531 Weakness: Secondary | ICD-10-CM

## 2018-02-04 DIAGNOSIS — R413 Other amnesia: Secondary | ICD-10-CM

## 2018-02-04 NOTE — Progress Notes (Signed)
[facility] Telephone: [phone] Fax: [phone]  PATIENT NAME: Donald Blanchard DOB: Jul 22, 1930 MRN: [id number]  PRIMARY CARE PROVIDER:   [facility] REFERRING PROVIDER:  [facility] RESPONSIBLE PARTY:   Berton Bon daughter health care power-of-attorney [phone] or [phone]  RECOMMENDATIONS and PLAN:  1. Palliative care encounter Z51.5; Palliative medicine team will continue to support patient, patient's family, and medical team. Visit consisted of counseling and education dealing with the complex and emotionally intense issues of symptom management and palliative care in the setting of serious and potentially life-threatening illness  2. Memory loss R41.3 secondary to dementia. Continue with supportive measures as chronic disease remains Progressive.  3. Generalized weakness R53.1  secondary to dementia, continue with therapy as able. Encourage energy conservation and rest times.   ASSESSMENT:     I visited and observed [name]. We talked about purpose for palliative care visit. He verbalizes he is very thankful for palliative care and support provided. We talked about how he was feeling today. He verbalized said he's doing well. We talked about symptoms of pain and shortness of breath which he did nice. We talked about his appetite and he verbalizes it is improving. He talked about foods that he likes more than others. We talked about his mobility. He talked about spending more time in the wheelchair. We talked about going two activities, being social. We talked about going to the dining two activities, being social. He talked about Valentine's Day coming up in that he needs to figure out what to get for his daughter and his wife. We talked about his wife who currently resides in locked memory care unit at [facility] secondary to dementia. We talked about his interactions with her. [name] endorses that he is hoping to be able to spend time with  her on Valentine's Day. We talked about role of palliative care and plan of care. He does appear to be currently stable. Today he looked better than he has in previous palliative care visits. Therapeutic listening and emotional support provided. Discuss that will contact his daughter Aram Beecham for update on palliative care visit. Mr [name] in agreement. He smiled throughout palliative care visit and seemed very content. I updated staff the new changes to current goals are plan of care.   I spent 60 minutes providing this consultation,  from 9:30am to 10:30am. More than 50% of the time in this consultation was spent coordinating communication.   HISTORY OF PRESENT ILLNESS:  Donald Blanchard is a 85 y.o. 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 [name] continues to reside in assisted living facility at [facility]. He is a assist transfer to the wheelchair where he does spend most of his time. He does continue to go to the dining area for meals. Appetite has been improving for staff. He is able to verbalize his needs. No recent infections, wounds, hospitalizations, Falls. DNR just remain in place. At present he is sitting in the wheelchair in his room. He appears comfortable. No visitors present. Palliative Care was asked to help address goals of care.   CODE STATUS: DNR  PPS: 40% HOSPICE ELIGIBILITY/DIAGNOSIS: TBD  PAST MEDICAL HISTORY:  Past Medical History:  Diagnosis Date  . A-fib (HCC)   . COPD (chronic obstructive pulmonary disease) (HCC)    not on home o2  . Emphysema of lung (HCC)   .  Sciatica     SOCIAL HX:  Social History   Tobacco Use  . Smoking status: Former Smoker    Types: Cigarettes    Last attempt to quit: 01/02/2004    Years since quitting: 14.1  . Smokeless tobacco: Never Used  .  Tobacco comment: Quit 10 years ago  Substance Use Topics  . Alcohol use: No    Alcohol/week: 0.0 standard drinks    ALLERGIES: No Known Allergies   PERTINENT MEDICATIONS:  Outpatient Encounter Medications as of 83/04/2018  Medication Sig  . acetaminophen (TYLENOL) 325 MG tablet Take 650 mg by mouth every 6 (six) hours as needed for mild pain, moderate pain or fever.   Marland Kitchen albuterol (PROVENTIL) (2.5 MG/3ML) 0.083% nebulizer solution Take 3 mLs (2.5 mg total) by nebulization every 6 (six) hours. Dx copd  . amLODipine (NORVASC) 2.5 MG tablet Take 1 tablet (2.5 mg total) by mouth daily.  . fluconazole (DIFLUCAN) 100 MG tablet Take 1 tablet (100 mg total) by mouth daily.  . meloxicam (MOBIC) 7.5 MG tablet Take 7.5 mg by mouth daily.  . mirtazapine (REMERON) 7.5 MG tablet Take 7.5 mg by mouth at bedtime.  . Multiple Vitamin (MULTIVITAMIN WITH MINERALS) TABS tablet Take 1 tablet by mouth daily.  Marland Kitchen omeprazole (PRILOSEC) 20 MG capsule Take 20 mg by mouth daily.  Marland Kitchen tiotropium (SPIRIVA) 18 MCG inhalation capsule Place 18 mcg into inhaler and inhale daily.  . traMADol (ULTRAM) 50 MG tablet Take 50 mg by mouth 2 (two) times daily.   No facility-administered encounter medications on file as of 02/04/2018.     PHYSICAL EXAM:   General: NAD, frail appearing, thin, pleasant male Cardiovascular: regular rate and rhythm Pulmonary: clear ant fields Abdomen: soft, nontender, + bowel sounds GU: no suprapubic tenderness Extremities: no edema, no joint deformities Skin: no rashes Neurological: Weakness but otherwise nonfocal/functional impairment; w/c dependent   Donald Rome, NP

## 2018-02-11 ENCOUNTER — Telehealth: Payer: Self-pay | Admitting: Nurse Practitioner

## 2018-02-11 NOTE — Telephone Encounter (Signed)
I called Donald Blanchard, Donald Blanchard daughter, message left for clinical update on palliative care visit to return call.

## 2018-05-19 ENCOUNTER — Encounter: Payer: Self-pay | Admitting: Emergency Medicine

## 2018-05-19 ENCOUNTER — Other Ambulatory Visit: Payer: Self-pay

## 2018-05-19 ENCOUNTER — Inpatient Hospital Stay
Admission: EM | Admit: 2018-05-19 | Discharge: 2018-06-02 | DRG: 177 | Disposition: E | Payer: Medicare Other | Source: Skilled Nursing Facility | Attending: Internal Medicine | Admitting: Internal Medicine

## 2018-05-19 DIAGNOSIS — M5430 Sciatica, unspecified side: Secondary | ICD-10-CM | POA: Diagnosis present

## 2018-05-19 DIAGNOSIS — I10 Essential (primary) hypertension: Secondary | ICD-10-CM | POA: Diagnosis present

## 2018-05-19 DIAGNOSIS — Z791 Long term (current) use of non-steroidal anti-inflammatories (NSAID): Secondary | ICD-10-CM

## 2018-05-19 DIAGNOSIS — I482 Chronic atrial fibrillation, unspecified: Secondary | ICD-10-CM | POA: Diagnosis present

## 2018-05-19 DIAGNOSIS — I4510 Unspecified right bundle-branch block: Secondary | ICD-10-CM | POA: Diagnosis present

## 2018-05-19 DIAGNOSIS — Z8249 Family history of ischemic heart disease and other diseases of the circulatory system: Secondary | ICD-10-CM

## 2018-05-19 DIAGNOSIS — L89150 Pressure ulcer of sacral region, unstageable: Secondary | ICD-10-CM

## 2018-05-19 DIAGNOSIS — Z20828 Contact with and (suspected) exposure to other viral communicable diseases: Secondary | ICD-10-CM | POA: Diagnosis present

## 2018-05-19 DIAGNOSIS — R1310 Dysphagia, unspecified: Secondary | ICD-10-CM | POA: Diagnosis present

## 2018-05-19 DIAGNOSIS — Z515 Encounter for palliative care: Secondary | ICD-10-CM | POA: Diagnosis not present

## 2018-05-19 DIAGNOSIS — E46 Unspecified protein-calorie malnutrition: Secondary | ICD-10-CM | POA: Diagnosis present

## 2018-05-19 DIAGNOSIS — J690 Pneumonitis due to inhalation of food and vomit: Principal | ICD-10-CM | POA: Diagnosis present

## 2018-05-19 DIAGNOSIS — R0602 Shortness of breath: Secondary | ICD-10-CM | POA: Diagnosis not present

## 2018-05-19 DIAGNOSIS — Z79899 Other long term (current) drug therapy: Secondary | ICD-10-CM

## 2018-05-19 DIAGNOSIS — Z66 Do not resuscitate: Secondary | ICD-10-CM | POA: Diagnosis present

## 2018-05-19 DIAGNOSIS — F419 Anxiety disorder, unspecified: Secondary | ICD-10-CM | POA: Diagnosis not present

## 2018-05-19 DIAGNOSIS — Z905 Acquired absence of kidney: Secondary | ICD-10-CM

## 2018-05-19 DIAGNOSIS — Z681 Body mass index (BMI) 19 or less, adult: Secondary | ICD-10-CM

## 2018-05-19 DIAGNOSIS — Z87891 Personal history of nicotine dependence: Secondary | ICD-10-CM

## 2018-05-19 DIAGNOSIS — J189 Pneumonia, unspecified organism: Secondary | ICD-10-CM | POA: Diagnosis not present

## 2018-05-19 DIAGNOSIS — J439 Emphysema, unspecified: Secondary | ICD-10-CM | POA: Diagnosis present

## 2018-05-19 DIAGNOSIS — J9621 Acute and chronic respiratory failure with hypoxia: Secondary | ICD-10-CM | POA: Diagnosis present

## 2018-05-19 DIAGNOSIS — T17908A Unspecified foreign body in respiratory tract, part unspecified causing other injury, initial encounter: Secondary | ICD-10-CM

## 2018-05-19 DIAGNOSIS — Y95 Nosocomial condition: Secondary | ICD-10-CM | POA: Diagnosis present

## 2018-05-19 DIAGNOSIS — F0390 Unspecified dementia without behavioral disturbance: Secondary | ICD-10-CM | POA: Diagnosis present

## 2018-05-19 DIAGNOSIS — Z9181 History of falling: Secondary | ICD-10-CM

## 2018-05-19 NOTE — ED Provider Notes (Signed)
[facility] Emergency Department Provider Note  ____________________________________________  Time seen: Approximately 11:59 PM  I have reviewed the triage vital signs and the nursing notes.   HISTORY  Chief Complaint Shortness of Breath  Level 5 caveat:  Portions of the history and physical were unable to be obtained due to dementia   HPI Donald Blanchard is a 85 y.o. male with a history of A. fib not on anticoagulation, COPD on 2 L nasal cannula, dementia, anemia who presents for evaluation of cough and shortness of breath.  According to EMS, patient had a chest x-ray at his skilled nursing facility today for cough.  The chest x-ray was positive for pneumonia.  Patient with increased oxygen requirement from 2 L to 4 L.  Patient was sent to the emergency room for further evaluation.  Patient is unable to provide any history which is his baseline.  Past Medical History:  Diagnosis Date   A-fib [facility])    COPD (chronic obstructive pulmonary disease) (HCC)    not on home o2   Emphysema of lung (HCC)    Sciatica     Patient Active Problem List   Diagnosis Date Noted   Memory deficit 02/04/2018   Weakness generalized 02/04/2018   Memory loss 12/10/2017   Unsteady gait 12/10/2017   Anorexia 12/10/2017   Palliative care encounter 12/10/2017   Protein-calorie malnutrition, severe 10/02/2017   Pressure injury of skin 10/01/2017   Altered mental status 01/04/2017   Right upper lobe pneumonia (HCC) 01/08/2015   Emphysema lung (HCC) 01/08/2015   Leukocytosis 01/08/2015   Anemia 01/08/2015   Atrial fibrillation (HCC) 01/08/2015   Dementia with behavioral disturbance (HCC) 01/08/2015   Dysphagia 01/08/2015   Sepsis (HCC) 01/06/2015    Past Surgical History:  Procedure Laterality Date   NEPHRECTOMY     Left- secondary to trauma   right lung lesion removal      Prior to Admission medications   Medication Sig Start Date End Date  Taking? Authorizing Provider  acetaminophen (TYLENOL) 325 MG tablet Take 650 mg by mouth every 6 (six) hours as needed for mild pain or fever.    Yes [provider]  albuterol (PROVENTIL) (2.5 MG/3ML) 0.083% nebulizer solution Take 2.5 mg by nebulization every 4 (four) hours as needed for shortness of breath (decreased O2 saturation).   Yes [provider]  amLODipine (NORVASC) 2.5 MG tablet Take 1 tablet (2.5 mg total) by mouth daily. 10/05/17  Yes Wieting, Richard, MD  feeding supplement, ENSURE, (ENSURE) PUDG Take 1 Container by mouth 2 (two) times a day. With lunch and dinner   Yes [provider]  meloxicam (MOBIC) 7.5 MG tablet Take 7.5 mg by mouth daily.   Yes [provider]  mirtazapine (REMERON) 7.5 MG tablet Take 7.5 mg by mouth at bedtime.   Yes [provider]  Multiple Vitamin (MULTIVITAMIN WITH MINERALS) TABS tablet Take 1 tablet by mouth daily.   Yes [provider]  omeprazole (PRILOSEC) 20 MG capsule Take 20 mg by mouth daily.   Yes [provider]  tiotropium (SPIRIVA) 18 MCG inhalation capsule Place 18 mcg into inhaler and inhale daily.   Yes [provider]  traMADol (ULTRAM) 50 MG tablet Take 50 mg by mouth 2 (two) times daily. Hold if resident is sleepy   Yes [provider]  traZODone (DESYREL) 50 MG tablet Take 25 mg by mouth at bedtime.   Yes [provider]  vitamin C (ASCORBIC ACID)  500 MG tablet Take 500 mg by mouth daily.   Yes [provider]  zinc sulfate 220 (50 Zn) MG capsule Take 220 mg by mouth daily.   Yes [provider]  albuterol (PROVENTIL) (2.5 MG/3ML) 0.083% nebulizer solution Take 3 mLs (2.5 mg total) by nebulization every 6 (six) hours. Dx copd Patient not taking: Reported on 05/20/2018 10/04/17   Alford Highland, MD  fluconazole (DIFLUCAN) 100 MG tablet Take 1 tablet (100 mg total) by mouth daily. Patient not taking: Reported on 05/20/2018 10/05/17    Alford Highland, MD    Allergies Patient has no known allergies.  Family History  Problem Relation Age of Onset   Hypertension Father     Social History Social History   Tobacco Use   Smoking status: Former Smoker    Types: Cigarettes    Last attempt to quit: 01/02/2004    Years since quitting: 14.3   Smokeless tobacco: Never Used   Tobacco comment: Quit 10 years ago  Substance Use Topics   Alcohol use: No    Alcohol/week: 0.0 standard drinks   Drug use: No    Review of Systems  Constitutional: Negative for fever. Respiratory: + shortness of breath and cough Gastrointestinal: Negative for vomiting or diarrhea.  ____________________________________________   PHYSICAL EXAM:  VITAL SIGNS: ED Triage Vitals  Enc Vitals Group     BP --      Pulse --      Resp --      Temp Jun 06, 2018 2355 97.8 F (36.6 C)     Temp Source June 06, 2018 2355 Oral     SpO2 --      Weight 06-06-18 2356 135 lb (61.2 kg)     Height 06/06/2018 2356  (1.88 m)     Head Circumference --      Peak Flow --      Pain Score 2018/06/06 2355 0     Pain Loc --      Pain Edu? --      Excl. in GC? --     Constitutional: Alert and oriented to self only, moderate respiratory distres HEENT:      Head: Normocephalic and atraumatic.         Eyes: Conjunctivae are normal. Sclera is non-icteric.       Mouth/Throat: Mucous membranes are moist.       Neck: Supple with no signs of meningismus. Cardiovascular: Regular rate and rhythm. No murmurs, gallops, or rubs. 2+ symmetrical distal pulses are present in all extremities. No JVD. Respiratory: Increased work of breathing, diffuse coarse rhonchi and wheezing bilaterally  gastrointestinal: Soft, non tender, and non distended with positive bowel sounds. No rebound or guarding. Musculoskeletal: Nontender with normal range of motion in all extremities. No edema, cyanosis, or erythema of extremities. Neurologic: Normal speech and language. Face is symmetric.  Moving all extremities. No gross focal neurologic deficits are appreciated. Skin: Skin is warm, dry and intact. No rash noted. Psychiatric: Mood and affect are normal. Speech and behavior are normal.  ____________________________________________   LABS (all labs ordered are listed, but only abnormal results are displayed)  Labs Reviewed  CBC WITH DIFFERENTIAL/PLATELET - Abnormal; Notable for the following components:      Result Value   WBC 12.6 (*)    RBC 3.90 (*)    Hemoglobin 12.3 (*)    HCT 37.8 (*)    RDW 21.0 (*)    Neutro Abs 9.5 (*)    Monocytes Absolute 1.6 (*)  All other components within normal limits  COMPREHENSIVE METABOLIC PANEL - Abnormal; Notable for the following components:   Glucose, Bld 107 (*)    Albumin 3.2 (*)    AST 44 (*)    All other components within normal limits  SARS CORONAVIRUS 2 (HOSPITAL ORDER, PERFORMED IN [facility])  CULTURE, BLOOD (ROUTINE X 2)  CULTURE, BLOOD (ROUTINE X 2)  LACTIC ACID, PLASMA  TROPONIN I   ____________________________________________  EKG  ED ECG REPORT I, Nita Sickle, the attending physician, personally viewed and interpreted this ECG.   Atrial fibrillation, rate of 78, right bundle branch block, normal QTC, no ST elevations or depressions, T wave inversions in anterior lateral leads. Unchanged from prior ____________________________________________  RADIOLOGY  I have personally reviewed the images performed during this visit and I agree with the Radiologist's read.   Interpretation by Radiologist:  Dg Chest Portable 1 View  Result Date: 05/20/2018 CLINICAL DATA:  85 year old male with cough and hypoxia. EXAM: PORTABLE CHEST 1 VIEW COMPARISON:  09/03/2017 and earlier. FINDINGS: Portable AP upright view at 0003 hours. Chronic severe upper lung disease with bronchiectasis, architectural distortion as seen by CT in 2019. Increased superimposed consolidation/opacification of the right upper  lobe since September. Similar new confluent opacity at the right lung base. Underlying coarse bilateral pulmonary interstitial opacity, with stable honeycombing at the left lung base. Stable cardiac size and mediastinal contours. Visualized tracheal air column is within normal limits. No pneumothorax. Possible small right pleural effusion. IMPRESSION: Severe chronic lung disease with superimposed acute right upper lobe and lower lobe consolidation suspicious for multifocal acute infectious exacerbation. Possible small right pleural effusion. Electronically Signed   By: Odessa Fleming M.D.   On: 05/20/2018 00:39      ____________________________________________   PROCEDURES  Procedure(s) performed: None Procedures Critical Care performed: yes  CRITICAL CARE Performed by: Nita Sickle  ?  Total critical care time: 35 min  Critical care time was exclusive of separately billable procedures and treating other patients.  Critical care was necessary to treat or prevent imminent or life-threatening deterioration.  Critical care was time spent personally by me on the following activities: development of treatment plan with patient and/or surrogate as well as nursing, discussions with consultants, evaluation of patient's response to treatment, examination of patient, obtaining history from patient or surrogate, ordering and performing treatments and interventions, ordering and review of laboratory studies, ordering and review of radiographic studies, pulse oximetry and re-evaluation of patient's condition.  ____________________________________________   INITIAL IMPRESSION / ASSESSMENT AND PLAN / ED COURSE   85 y.o. male with a history of A. fib not on anticoagulation, COPD on 2 L nasal cannula, dementia, anemia who presents for evaluation of cough and shortness of breath.  Patient sating in the mid 80s on his baseline 2 L which improved to upper 90s on 4 L.  Lung auscultation showing diffuse  coarse rhonchi and wheezing bilaterally.  Differential diagnoses including pneumonia versus COPD exacerbation versus viral illness versus COVID.  We will do a chest x-ray, labs, COVID swab.  Will treat with duo nebs and Solu-Medrol.    _________________________ 12:45 AM on 05/20/2018 -----------------------------------------  Chest x-ray concerning for right upper lobe and lower lobe pneumonia.  White count is elevated at 12.  No tachycardia, tachypnea, lactic acidosis.  Patient does not meet sepsis criteria.  Since he is from a nursing home will cover with cefepime and Vanco.  Will admit to the hospitalist service. COVID negative   As  part of my medical decision making, I reviewed the following data within the electronic MEDICAL RECORD NUMBER Nursing notes reviewed and incorporated, Labs reviewed , EKG interpreted , Old EKG reviewed, Old chart reviewed, Radiograph reviewed , Discussed with admitting physician , Notes from prior ED visits and  Controlled Substance Database    Pertinent labs & imaging results that were available during my care of the patient were reviewed by me and considered in my medical decision making (see chart for details).    ____________________________________________   FINAL CLINICAL IMPRESSION(S) / ED DIAGNOSES  Final diagnoses:  HCAP (healthcare-associated pneumonia)  Acute on chronic respiratory failure with hypoxia (HCC)      NEW MEDICATIONS STARTED DURING THIS VISIT:  ED Discharge Orders    None       Note:  This document was prepared using Dragon voice recognition software and may include unintentional dictation errors.    Don PerkingVeronese, [place], MD 05/20/18 [phone]

## 2018-05-19 NOTE — ED Triage Notes (Signed)
Pt presents from [place] via [facility] with c/o low oxygenation saturation. Pt diagnosed with pneumonia at facility today. pt has had low oxygen all day according to facility. Pt was rpeorted to be 85% on 2L. Pt wears 2L chronically. EMS placed pt on 6L and oxygen saturation came up to 98%. 20G in RAC placed ems. hx of copd, emphysema. Pt 86% on room air upon arrival to ED.

## 2018-05-20 ENCOUNTER — Emergency Department: Payer: Medicare Other

## 2018-05-20 DIAGNOSIS — M5430 Sciatica, unspecified side: Secondary | ICD-10-CM | POA: Diagnosis present

## 2018-05-20 DIAGNOSIS — F0390 Unspecified dementia without behavioral disturbance: Secondary | ICD-10-CM | POA: Diagnosis present

## 2018-05-20 DIAGNOSIS — Z87891 Personal history of nicotine dependence: Secondary | ICD-10-CM | POA: Diagnosis not present

## 2018-05-20 DIAGNOSIS — F419 Anxiety disorder, unspecified: Secondary | ICD-10-CM | POA: Diagnosis not present

## 2018-05-20 DIAGNOSIS — I10 Essential (primary) hypertension: Secondary | ICD-10-CM | POA: Diagnosis present

## 2018-05-20 DIAGNOSIS — J690 Pneumonitis due to inhalation of food and vomit: Secondary | ICD-10-CM | POA: Diagnosis present

## 2018-05-20 DIAGNOSIS — Z7189 Other specified counseling: Secondary | ICD-10-CM | POA: Diagnosis not present

## 2018-05-20 DIAGNOSIS — J439 Emphysema, unspecified: Secondary | ICD-10-CM | POA: Diagnosis present

## 2018-05-20 DIAGNOSIS — L89150 Pressure ulcer of sacral region, unstageable: Secondary | ICD-10-CM | POA: Diagnosis present

## 2018-05-20 DIAGNOSIS — Z905 Acquired absence of kidney: Secondary | ICD-10-CM | POA: Diagnosis not present

## 2018-05-20 DIAGNOSIS — R1310 Dysphagia, unspecified: Secondary | ICD-10-CM | POA: Diagnosis present

## 2018-05-20 DIAGNOSIS — Z8249 Family history of ischemic heart disease and other diseases of the circulatory system: Secondary | ICD-10-CM | POA: Diagnosis not present

## 2018-05-20 DIAGNOSIS — Z791 Long term (current) use of non-steroidal anti-inflammatories (NSAID): Secondary | ICD-10-CM | POA: Diagnosis not present

## 2018-05-20 DIAGNOSIS — R0602 Shortness of breath: Secondary | ICD-10-CM | POA: Diagnosis present

## 2018-05-20 DIAGNOSIS — E46 Unspecified protein-calorie malnutrition: Secondary | ICD-10-CM | POA: Diagnosis present

## 2018-05-20 DIAGNOSIS — Z681 Body mass index (BMI) 19 or less, adult: Secondary | ICD-10-CM | POA: Diagnosis not present

## 2018-05-20 DIAGNOSIS — Y95 Nosocomial condition: Secondary | ICD-10-CM | POA: Diagnosis present

## 2018-05-20 DIAGNOSIS — Z9181 History of falling: Secondary | ICD-10-CM | POA: Diagnosis not present

## 2018-05-20 DIAGNOSIS — J9621 Acute and chronic respiratory failure with hypoxia: Secondary | ICD-10-CM | POA: Diagnosis not present

## 2018-05-20 DIAGNOSIS — Z20828 Contact with and (suspected) exposure to other viral communicable diseases: Secondary | ICD-10-CM | POA: Diagnosis present

## 2018-05-20 DIAGNOSIS — J189 Pneumonia, unspecified organism: Secondary | ICD-10-CM | POA: Diagnosis present

## 2018-05-20 DIAGNOSIS — I4510 Unspecified right bundle-branch block: Secondary | ICD-10-CM | POA: Diagnosis present

## 2018-05-20 DIAGNOSIS — Z66 Do not resuscitate: Secondary | ICD-10-CM | POA: Diagnosis present

## 2018-05-20 DIAGNOSIS — I482 Chronic atrial fibrillation, unspecified: Secondary | ICD-10-CM | POA: Diagnosis present

## 2018-05-20 DIAGNOSIS — Z515 Encounter for palliative care: Secondary | ICD-10-CM | POA: Diagnosis not present

## 2018-05-20 DIAGNOSIS — Z79899 Other long term (current) drug therapy: Secondary | ICD-10-CM | POA: Diagnosis not present

## 2018-05-20 LAB — CBC WITH DIFFERENTIAL/PLATELET
Abs Immature Granulocytes: 0.06 10*3/uL (ref 0.00–0.07)
Basophils Absolute: 0 10*3/uL (ref 0.0–0.1)
Basophils Relative: 0 %
Eosinophils Absolute: 0.1 10*3/uL (ref 0.0–0.5)
Eosinophils Relative: 1 %
HCT: 37.8 % — ABNORMAL LOW (ref 39.0–52.0)
Hemoglobin: 12.3 g/dL — ABNORMAL LOW (ref 13.0–17.0)
Immature Granulocytes: 1 %
Lymphocytes Relative: 10 %
Lymphs Abs: 1.3 10*3/uL (ref 0.7–4.0)
MCH: 31.5 pg (ref 26.0–34.0)
MCHC: 32.5 g/dL (ref 30.0–36.0)
MCV: 96.9 fL (ref 80.0–100.0)
Monocytes Absolute: 1.6 10*3/uL — ABNORMAL HIGH (ref 0.1–1.0)
Monocytes Relative: 13 %
Neutro Abs: 9.5 10*3/uL — ABNORMAL HIGH (ref 1.7–7.7)
Neutrophils Relative %: 75 %
Platelets: 271 10*3/uL (ref 150–400)
RBC: 3.9 MIL/uL — ABNORMAL LOW (ref 4.22–5.81)
RDW: 21 % — ABNORMAL HIGH (ref 11.5–15.5)
WBC: 12.6 10*3/uL — ABNORMAL HIGH (ref 4.0–10.5)
nRBC: 0 % (ref 0.0–0.2)

## 2018-05-20 LAB — COMPREHENSIVE METABOLIC PANEL
ALT: 10 U/L (ref 0–44)
AST: 44 U/L — ABNORMAL HIGH (ref 15–41)
Albumin: 3.2 g/dL — ABNORMAL LOW (ref 3.5–5.0)
Alkaline Phosphatase: 90 U/L (ref 38–126)
Anion gap: 9 (ref 5–15)
BUN: 23 mg/dL (ref 8–23)
CO2: 26 mmol/L (ref 22–32)
Calcium: 8.9 mg/dL (ref 8.9–10.3)
Chloride: 106 mmol/L (ref 98–111)
Creatinine, Ser: 1.01 mg/dL (ref 0.61–1.24)
GFR calc Af Amer: >60 mL/min (ref >60)
GFR calc non Af Amer: >60 mL/min (ref >60)
Glucose, Bld: 107 mg/dL — ABNORMAL HIGH (ref 70–99)
Potassium: 4.1 mmol/L (ref 3.5–5.1)
Sodium: 141 mmol/L (ref 135–145)
Total Bilirubin: 0.9 mg/dL (ref 0.3–1.2)
Total Protein: 6.8 g/dL (ref 6.5–8.1)

## 2018-05-20 LAB — LACTIC ACID, PLASMA: Lactic Acid, Venous: 1.3 mmol/L (ref 0.5–1.9)

## 2018-05-20 LAB — SARS CORONAVIRUS 2 BY RT PCR (HOSPITAL ORDER, PERFORMED IN ~~LOC~~ HOSPITAL LAB): SARS Coronavirus 2: NEGATIVE

## 2018-05-20 LAB — HEMOGLOBIN A1C
Hgb A1c MFr Bld: 5.2 % (ref 4.8–5.6)
Mean Plasma Glucose: 102.54 mg/dL

## 2018-05-20 LAB — MRSA PCR SCREENING: MRSA by PCR: NEGATIVE

## 2018-05-20 LAB — PROCALCITONIN: Procalcitonin: <0.1 ng/mL

## 2018-05-20 LAB — TSH: TSH: 2.29 u[IU]/mL (ref 0.350–4.500)

## 2018-05-20 LAB — TROPONIN I: Troponin I: <0.03 ng/mL (ref <0.03)

## 2018-05-20 MED ORDER — VANCOMYCIN HCL 10 G IV SOLR
1250.0000 mg | INTRAVENOUS | Status: DC
  Filled 2018-05-20: qty 1250

## 2018-05-20 MED ORDER — ALBUTEROL SULFATE (2.5 MG/3ML) 0.083% IN NEBU: 2.5000 mg | INHALATION_SOLUTION | RESPIRATORY_TRACT | Status: DC | PRN

## 2018-05-20 MED ORDER — VANCOMYCIN HCL 500 MG IV SOLR
500.0000 mg | Freq: Once | INTRAVENOUS | Status: AC
  Administered 2018-05-20: 500 mg via INTRAVENOUS
  Filled 2018-05-20: qty 500

## 2018-05-20 MED ORDER — TRAMADOL HCL 50 MG PO TABS
50.0000 mg | ORAL_TABLET | Freq: Two times a day (BID) | ORAL | Status: DC
  Administered 2018-05-20 – 2018-05-24 (×7): 50 mg via ORAL
  Filled 2018-05-20 (×7): qty 1

## 2018-05-20 MED ORDER — ACETAMINOPHEN 650 MG RE SUPP: 650.0000 mg | Freq: Four times a day (QID) | RECTAL | Status: DC | PRN

## 2018-05-20 MED ORDER — ACETAMINOPHEN 325 MG PO TABS
650.0000 mg | ORAL_TABLET | Freq: Four times a day (QID) | ORAL | Status: DC | PRN
  Administered 2018-05-20: 23:00:00 650 mg via ORAL
  Filled 2018-05-20: qty 2

## 2018-05-20 MED ORDER — IPRATROPIUM-ALBUTEROL 0.5-2.5 (3) MG/3ML IN SOLN
3.0000 mL | Freq: Once | RESPIRATORY_TRACT | Status: AC
  Administered 2018-05-20: 04:00:00 3 mL via RESPIRATORY_TRACT

## 2018-05-20 MED ORDER — SODIUM CHLORIDE 0.9 % IV SOLN
INTRAVENOUS | Status: DC
  Administered 2018-05-20 (×2): via INTRAVENOUS

## 2018-05-20 MED ORDER — ADULT MULTIVITAMIN W/MINERALS CH
1.0000 | ORAL_TABLET | Freq: Every day | ORAL | Status: DC
  Administered 2018-05-20 – 2018-05-24 (×4): 1 via ORAL
  Filled 2018-05-20 (×4): qty 1

## 2018-05-20 MED ORDER — SODIUM CHLORIDE 0.9 % IV SOLN
2.0000 g | Freq: Two times a day (BID) | INTRAVENOUS | Status: DC
  Administered 2018-05-20 – 2018-05-22 (×4): 2 g via INTRAVENOUS
  Filled 2018-05-20 (×7): qty 2

## 2018-05-20 MED ORDER — ZINC SULFATE 220 (50 ZN) MG PO CAPS
220.0000 mg | ORAL_CAPSULE | Freq: Every day | ORAL | Status: DC
  Administered 2018-05-20 – 2018-05-24 (×4): 220 mg via ORAL
  Filled 2018-05-20 (×6): qty 1

## 2018-05-20 MED ORDER — ONDANSETRON HCL 4 MG PO TABS: 4.0000 mg | ORAL_TABLET | Freq: Four times a day (QID) | ORAL | Status: DC | PRN

## 2018-05-20 MED ORDER — MELOXICAM 7.5 MG PO TABS
7.5000 mg | ORAL_TABLET | Freq: Every day | ORAL | Status: DC
  Administered 2018-05-20 – 2018-05-24 (×3): 7.5 mg via ORAL
  Filled 2018-05-20 (×6): qty 1

## 2018-05-20 MED ORDER — DOCUSATE SODIUM 100 MG PO CAPS
100.0000 mg | ORAL_CAPSULE | Freq: Two times a day (BID) | ORAL | Status: DC
  Administered 2018-05-20 – 2018-05-24 (×7): 100 mg via ORAL
  Filled 2018-05-20 (×7): qty 1

## 2018-05-20 MED ORDER — ENOXAPARIN SODIUM 40 MG/0.4ML ~~LOC~~ SOLN
40.0000 mg | SUBCUTANEOUS | Status: DC
  Administered 2018-05-20 – 2018-05-24 (×4): 40 mg via SUBCUTANEOUS
  Filled 2018-05-20 (×5): qty 0.4

## 2018-05-20 MED ORDER — AMLODIPINE BESYLATE 5 MG PO TABS
2.5000 mg | ORAL_TABLET | Freq: Every day | ORAL | Status: DC
  Administered 2018-05-20 – 2018-05-24 (×3): 2.5 mg via ORAL
  Filled 2018-05-20 (×3): qty 1

## 2018-05-20 MED ORDER — IPRATROPIUM-ALBUTEROL 0.5-2.5 (3) MG/3ML IN SOLN
3.0000 mL | Freq: Once | RESPIRATORY_TRACT | Status: AC
  Administered 2018-05-20: 3 mL via RESPIRATORY_TRACT

## 2018-05-20 MED ORDER — ENSURE PUDDING PO PUDG
1.0000 | Freq: Two times a day (BID) | ORAL | Status: DC
  Administered 2018-05-20 – 2018-05-21 (×3): 1 via ORAL
  Filled 2018-05-20: qty 1

## 2018-05-20 MED ORDER — SODIUM CHLORIDE 0.9 % IV SOLN
1.0000 g | Freq: Once | INTRAVENOUS | Status: AC
  Administered 2018-05-20: 01:00:00 1 g via INTRAVENOUS
  Filled 2018-05-20: qty 1

## 2018-05-20 MED ORDER — MIRTAZAPINE 15 MG PO TABS
7.5000 mg | ORAL_TABLET | Freq: Every day | ORAL | Status: DC
  Administered 2018-05-20 – 2018-05-23 (×3): 7.5 mg via ORAL
  Filled 2018-05-20 (×4): qty 1

## 2018-05-20 MED ORDER — TIOTROPIUM BROMIDE MONOHYDRATE 18 MCG IN CAPS
18.0000 ug | ORAL_CAPSULE | Freq: Every day | RESPIRATORY_TRACT | Status: DC
  Administered 2018-05-20 – 2018-05-24 (×4): 18 ug via RESPIRATORY_TRACT
  Filled 2018-05-20: qty 5

## 2018-05-20 MED ORDER — TRAZODONE HCL 50 MG PO TABS
25.0000 mg | ORAL_TABLET | Freq: Every day | ORAL | Status: DC
  Administered 2018-05-20 – 2018-05-23 (×3): 25 mg via ORAL
  Filled 2018-05-20 (×3): qty 1

## 2018-05-20 MED ORDER — ORAL CARE MOUTH RINSE
15.0000 mL | Freq: Two times a day (BID) | OROMUCOSAL | Status: DC
  Administered 2018-05-20 – 2018-05-26 (×10): 15 mL via OROMUCOSAL

## 2018-05-20 MED ORDER — VANCOMYCIN HCL 1.25 G IV SOLR
1250.0000 mg | INTRAVENOUS | Status: DC
  Filled 2018-05-20: qty 1250

## 2018-05-20 MED ORDER — VANCOMYCIN HCL IN DEXTROSE 1-5 GM/200ML-% IV SOLN
1000.0000 mg | Freq: Once | INTRAVENOUS | Status: AC
  Administered 2018-05-20: 1000 mg via INTRAVENOUS
  Filled 2018-05-20: qty 200

## 2018-05-20 MED ORDER — VITAMIN C 500 MG PO TABS
500.0000 mg | ORAL_TABLET | Freq: Every day | ORAL | Status: DC
  Administered 2018-05-20 – 2018-05-24 (×4): 500 mg via ORAL
  Filled 2018-05-20 (×4): qty 1

## 2018-05-20 MED ORDER — PANTOPRAZOLE SODIUM 40 MG PO TBEC
40.0000 mg | DELAYED_RELEASE_TABLET | Freq: Every day | ORAL | Status: DC
  Administered 2018-05-20 – 2018-05-24 (×4): 40 mg via ORAL
  Filled 2018-05-20 (×4): qty 1

## 2018-05-20 MED ORDER — IPRATROPIUM-ALBUTEROL 0.5-2.5 (3) MG/3ML IN SOLN
3.0000 mL | Freq: Once | RESPIRATORY_TRACT | Status: AC
  Administered 2018-05-20: 04:00:00 3 mL via RESPIRATORY_TRACT
  Filled 2018-05-20: qty 9

## 2018-05-20 MED ORDER — ONDANSETRON HCL 4 MG/2ML IJ SOLN: 4.0000 mg | Freq: Four times a day (QID) | INTRAMUSCULAR | Status: DC | PRN

## 2018-05-20 NOTE — H&P (Signed)
Donald Blanchard is an 85 y.o. male.   Chief Complaint: Cough HPI: The patient with past medical history of COPD and atrial fibrillation presents to the emergency department from his nursing home due to cough and increased oxygen requirement.  The patient is usually on 2 L of oxygen via nasal cannula and required 4 L to maintain oxygen saturations greater than 94%.  Chest x-ray as an outpatient and in the emergency department showed multifocal pneumonia.  At the time of arrival the patient was groggy and unable to provide history.  By the time this examiner interviewed the patient he was clearheaded and denied pain.  He does not believe that he has pneumonia but admits to a wet cough.  Denies chest pain.  The patient was started on IV antibiotics prior to the emergency department staff called the hospitalist service for admission.  Past Medical History:  Diagnosis Date  . A-fib (HCC)   . COPD (chronic obstructive pulmonary disease) (HCC)    not on home o2  . Emphysema of lung (HCC)   . Sciatica     Past Surgical History:  Procedure Laterality Date  . NEPHRECTOMY     Left- secondary to trauma  . right lung lesion removal      Family History  Problem Relation Age of Onset  . Hypertension Father    Social History:  reports that he quit smoking about 14 years ago. His smoking use included cigarettes. He has never used smokeless tobacco. He reports that he does not drink alcohol or use drugs.  Allergies: No Known Allergies  (Not in a hospital admission)   Results for orders placed or performed during the hospital encounter of 05/25/2018 (from the past 48 hour(s))  CBC with Differential/Platelet     Status: Abnormal   Collection Time: 05/20/18 12:00 AM  Result Value Ref Range   WBC 12.6 (H) 4.0 - 10.5 K/uL   RBC 3.90 (L) 4.22 - 5.81 MIL/uL   Hemoglobin 12.3 (L) 13.0 - 17.0 g/dL   HCT 40.9 (L) 81.1 - 91.4 %   MCV 96.9 80.0 - 100.0 fL   MCH 31.5 26.0 - 34.0 pg   MCHC 32.5 30.0 - 36.0 g/dL    RDW 78.2 (H) 95.6 - 15.5 %   Platelets 271 150 - 400 K/uL   nRBC 0.0 0.0 - 0.2 %   Neutrophils Relative % 75 %   Neutro Abs 9.5 (H) 1.7 - 7.7 K/uL   Lymphocytes Relative 10 %   Lymphs Abs 1.3 0.7 - 4.0 K/uL   Monocytes Relative 13 %   Monocytes Absolute 1.6 (H) 0.1 - 1.0 K/uL   Eosinophils Relative 1 %   Eosinophils Absolute 0.1 0.0 - 0.5 K/uL   Basophils Relative 0 %   Basophils Absolute 0.0 0.0 - 0.1 K/uL   Immature Granulocytes 1 %   Abs Immature Granulocytes 0.06 0.00 - 0.07 K/uL    Comment: Performed at [facility], [address] [place], [place] [address]  Comprehensive metabolic panel     Status: Abnormal   Collection Time: 05/20/18 12:00 AM  Result Value Ref Range   Sodium 141 135 - 145 mmol/L   Potassium 4.1 3.5 - 5.1 mmol/L   Chloride 106 98 - 111 mmol/L   CO2 26 22 - 32 mmol/L   Glucose, Bld 107 (H) 70 - 99 mg/dL   BUN 23 8 - 23 mg/dL   Creatinine, Ser 6.57 0.61 - 1.24 mg/dL   Calcium 8.9 8.9 -  10.3 mg/dL   Total Protein 6.8 6.5 - 8.1 g/dL   Albumin 3.2 (L) 3.5 - 5.0 g/dL   AST 44 (H) 15 - 41 U/L   ALT 10 0 - 44 U/L   Alkaline Phosphatase 90 38 - 126 U/L   Total Bilirubin 0.9 0.3 - 1.2 mg/dL   GFR calc non Af Amer >60 >60 mL/min   GFR calc Af Amer >60 >60 mL/min   Anion gap 9 5 - 15    Comment: Performed at [facility], [address] [place], [place] [address]  Lactic acid, plasma     Status: None   Collection Time: 05/20/18 12:00 AM  Result Value Ref Range   Lactic Acid, Venous 1.3 0.5 - 1.9 mmol/L    Comment: Performed at [facility], [address] [place], [place] [address]  Troponin I - ONCE - STAT     Status: None   Collection Time: 05/20/18 12:00 AM  Result Value Ref Range   Troponin I <0.03 <0.03 ng/mL    Comment: Performed at [facility], [address], [place], [place] [address]  SARS Coronavirus 2 (CEPHEID- Performed in [facility] hospital lab), Hosp Order     Status: None   Collection Time:  05/20/18 12:04 AM  Result Value Ref Range   SARS Coronavirus 2 NEGATIVE NEGATIVE    Comment: (NOTE) If result is NEGATIVE SARS-CoV-2 target nucleic acids are NOT DETECTED. The SARS-CoV-2 RNA is generally detectable in upper and lower  respiratory specimens during the acute phase of infection. The lowest  concentration of SARS-CoV-2 viral copies this assay can detect is 250  copies / mL. A negative result does not preclude SARS-CoV-2 infection  and should not be used as the sole basis for treatment or other  patient management decisions.  A negative result may occur with  improper specimen collection / handling, submission of specimen other  than nasopharyngeal swab, presence of viral mutation(s) within the  areas targeted by this assay, and inadequate number of viral copies  (<250 copies / mL). A negative result must be combined with clinical  observations, patient history, and epidemiological information. If result is POSITIVE SARS-CoV-2 target nucleic acids are DETECTED. The SARS-CoV-2 RNA is generally detectable in upper and lower  respiratory specimens dur ing the acute phase of infection.  Positive  results are indicative of active infection with SARS-CoV-2.  Clinical  correlation with patient history and other diagnostic information is  necessary to determine patient infection status.  Positive results do  not rule out bacterial infection or co-infection with other viruses. If result is PRESUMPTIVE POSTIVE SARS-CoV-2 nucleic acids MAY BE PRESENT.   A presumptive positive result was obtained on the submitted specimen  and confirmed on repeat testing.  While 2019 novel coronavirus  (SARS-CoV-2) nucleic acids may be present in the submitted sample  additional confirmatory testing may be necessary for epidemiological  and / or clinical management purposes  to differentiate between  SARS-CoV-2 and other Sarbecovirus currently known to infect humans.  If clinically indicated  additional testing with an alternate test  methodology [phone]) is advised. The SARS-CoV-2 RNA is generally  detectable in upper and lower respiratory sp ecimens during the acute  phase of infection. The expected result is Negative. Fact Sheet for Patients:  BoilerBrush.com.cy Fact Sheet for Healthcare Providers: https://pope.com/ This test is not yet approved or cleared by the [place] FDA and has been authorized for detection and/or diagnosis of SARS-CoV-2 by FDA  under an Emergency Use Authorization (EUA).  This EUA will remain in effect (meaning this test can be used) for the duration of the COVID-19 declaration under Section 564(b)(1) of the Act, 21 U.S.C. section 360bbb-3(b)(1), unless the authorization is terminated or revoked sooner. Performed at [facility], [address] [place], [place] [id number]    Dg Chest Portable 1 View  Result Date: 05/20/2018 CLINICAL DATA:  85 year old male with cough and hypoxia. EXAM: PORTABLE CHEST 1 VIEW COMPARISON:  09/03/2017 and earlier. FINDINGS: Portable AP upright view at 0003 hours. Chronic severe upper lung disease with bronchiectasis, architectural distortion as seen by CT in 2019. Increased superimposed consolidation/opacification of the right upper lobe since September. Similar new confluent opacity at the right lung base. Underlying coarse bilateral pulmonary interstitial opacity, with stable honeycombing at the left lung base. Stable cardiac size and mediastinal contours. Visualized tracheal air column is within normal limits. No pneumothorax. Possible small right pleural effusion. IMPRESSION: Severe chronic lung disease with superimposed acute right upper lobe and lower lobe consolidation suspicious for multifocal acute infectious exacerbation. Possible small right pleural effusion. Electronically Signed   By: Odessa FlemingH  Hall M.D.   On: 05/20/2018 00:39    Review of Systems   Constitutional: Negative for chills and fever.  HENT: Negative for sore throat and tinnitus.   Eyes: Negative for blurred vision and redness.  Respiratory: Positive for cough. Negative for shortness of breath.   Cardiovascular: Negative for chest pain, palpitations, orthopnea and PND.  Gastrointestinal: Negative for abdominal pain, diarrhea, nausea and vomiting.  Genitourinary: Negative for dysuria, frequency and urgency.  Musculoskeletal: Negative for joint pain and myalgias.  Skin: Negative for rash.       No lesions  Neurological: Negative for speech change, focal weakness and weakness.  Endo/Heme/Allergies: Does not bruise/bleed easily.       No temperature intolerance  Psychiatric/Behavioral: Negative for depression and suicidal ideas.    Blood pressure (!) 154/74, pulse 82, temperature 97.8 F (36.6 C), temperature source Oral, resp. rate 18, height 6\' 2"  (1.88 m), weight 61.2 kg, SpO2 (!) 88 %. Physical Exam  Constitutional: He is oriented to person, place, and time. He appears well-developed and well-nourished. No distress.  HENT:  Head: Normocephalic and atraumatic.  Mouth/Throat: Oropharynx is clear and moist.  Eyes: Pupils are equal, round, and reactive to light. Conjunctivae and EOM are normal. No scleral icterus.  Neck: Normal range of motion. Neck supple. No JVD present. No tracheal deviation present. No thyromegaly present.  Cardiovascular: Normal rate, regular rhythm and normal heart sounds. Exam reveals no gallop and no friction rub.  No murmur heard. Respiratory: Effort normal and breath sounds normal. No respiratory distress.  GI: Soft. Bowel sounds are normal. He exhibits no distension. There is no abdominal tenderness.  Genitourinary:    Genitourinary Comments: Deferred   Musculoskeletal: Normal range of motion.        General: No edema.  Lymphadenopathy:    He has no cervical adenopathy.  Neurological: He is alert and oriented to person, place, and time. No  cranial nerve deficit.  Skin: Skin is warm and dry. No rash noted. No erythema.  Psychiatric: He has a normal mood and affect. His behavior is normal. Judgment and thought content normal.     Assessment/Plan This is an 85 year old male admitted for pneumonia. 1.  Pneumonia: Healthcare associated; continue bank and cefepime.  Supplemental oxygen as required.  Wean O2 as tolerated.  The patient does not meet criteria for sepsis.  2.  COPD: Emphysematous; continue Spiriva as well as inhaled corticosteroid.  Albuterol as needed. 3.  Hypertension: Controlled; continue amlodipine 4.  Atrial fibrillation: The patient is not on anticoagulation as fall risk outweighs benefit.  Heart rate is controlled. 5.  DVT prophylaxis: [place] 6.  GI prophylaxis: PPI per home regimen The patient is a DNR.  Time spent on admission orders and patient care approximately 45 minutes  Arnaldo Natal, MD 05/20/2018, 3:56 AM

## 2018-05-20 NOTE — Progress Notes (Signed)
Pharmacy Antibiotic Note  Donald Blanchard is a 85 y.o. male admitted on 05/03/2018 with pneumonia.  Pharmacy has been consulted for vanc/cefepime dosing. Patient received vanc 1g + 500 mg IV for a total of 1.5g IV load, cefepime 1g, and flagyl 500 mg IV x 1  Plan: Vancomycin 1250 mg IV Q 24 hrs. Goal AUC 400-550. Expected AUC: 506.4 SCr used: 1.01 Cssmin: 10.9  Will continue cefepime 2g IV q12h per CrCl 30 - 60 ml/min and flagyl 500 mg IV q8h, will continue monitoring renal fx and s/sx of infx.  Height: 6\' 2"  (188 cm) Weight: 135 lb (61.2 kg) IBW/kg (Calculated) : 82.2  Temp (24hrs), Avg:97.8 F (36.6 C), Min:97.8 F (36.6 C), Max:97.8 F (36.6 C)  Recent Labs  Lab 05/20/18 0000  WBC 12.6*  CREATININE 1.01  LATICACIDVEN 1.3    Estimated Creatinine Clearance: 46.3 mL/min (by C-G formula based on SCr of 1.01 mg/dL).    No Known Allergies  Thank you for allowing pharmacy to be a part of this patient's care.  Thomasene Ripple, PharmD, BCPS Clinical Pharmacist 05/20/2018

## 2018-05-20 NOTE — Progress Notes (Signed)
Called Aram Beecham daughter and updated on plan of care. Collected history and establish password. Password is [email or web address]. Bed in the lowest position call bell within reach side rails up X2, will continue to monitor.

## 2018-05-20 NOTE — ED Notes (Signed)
Pt oxygen saturation 88% on 4L nasal cannula. Pt oxygen turned up to 5L. Pt oxygen saturation now 95%. MD Don Perking aware.

## 2018-05-20 NOTE — ED Notes (Signed)
Pt oxygen saturation 100% on 5L. Pt dropped back down to 4L at this time. MD Don Perking aware. Pt oxygen saturation currently 99% on 4L.

## 2018-05-20 NOTE — ED Notes (Signed)
Patient cleaned and brief changed to dry one.

## 2018-05-20 NOTE — ED Notes (Signed)
Attempted Report. Christina RN reported she has not approved room yet and room is not ready.

## 2018-05-20 NOTE — Progress Notes (Signed)
[facility] - [place] at [facility]   PATIENT NAME: Donald Blanchard    MR#:  [id number]  DATE OF BIRTH:  Mar 01, 1930  SUBJECTIVE:  CHIEF COMPLAINT:   Chief Complaint  Patient presents with  . Shortness of Breath  Patient seen and evaluated today Has shortness of breath Has cough COVID-19 test negative  REVIEW OF SYSTEMS:    ROS  CONSTITUTIONAL: No documented fever. Has fatigue, weakness. No weight gain, no weight loss.  EYES: No blurry or double vision.  ENT: No tinnitus. No postnasal drip. No redness of the oropharynx.  RESPIRATORY: Has cough, no wheeze, no hemoptysis. Has dyspnea.  CARDIOVASCULAR: No chest pain. No orthopnea. No palpitations. No syncope.  GASTROINTESTINAL: No nausea, no vomiting or diarrhea. No abdominal pain. No melena or hematochezia.  GENITOURINARY: No dysuria or hematuria.  ENDOCRINE: No polyuria or nocturia. No heat or cold intolerance.  HEMATOLOGY: No anemia. No bruising. No bleeding.  INTEGUMENTARY: No rashes. No lesions.  MUSCULOSKELETAL: No arthritis. No swelling. No gout.  NEUROLOGIC: No numbness, tingling, or ataxia. No seizure-type activity.  PSYCHIATRIC: No anxiety. No insomnia. No ADD.   DRUG ALLERGIES:  No Known Allergies  VITALS:  Blood pressure 137/63, pulse 71, temperature 97.8 F (36.6 C), temperature source Oral, resp. rate 20, height 6\' 2"  (1.88 m), weight 61.2 kg, SpO2 95 %.  PHYSICAL EXAMINATION:   Physical Exam  GENERAL:  85 y.o.-year-old patient lying in the bed with no acute distress.  EYES: Pupils equal, round, reactive to light and accommodation. No scleral icterus. Extraocular muscles intact.  HEENT: Head atraumatic, normocephalic. Oropharynx and nasopharynx clear.  NECK:  Supple, no jugular venous distention. No thyroid enlargement, no tenderness.  LUNGS: Decreased breath sounds bilaterally, rales heard in right lung. No use of accessory muscles of respiration.  CARDIOVASCULAR: S1, S2 normal. No murmurs,  rubs, or gallops.  ABDOMEN: Soft, nontender, nondistended. Bowel sounds present. No organomegaly or mass.  EXTREMITIES: No cyanosis, clubbing or edema b/l.    NEUROLOGIC: Cranial nerves II through XII are intact. No focal Motor or sensory deficits b/l.   PSYCHIATRIC: The patient is alert and oriented x 3.  SKIN: No obvious rash, lesion, or ulcer.   LABORATORY PANEL:   CBC Recent Labs  Lab 05/20/18 0000  WBC 12.6*  HGB 12.3*  HCT 37.8*  PLT 271   ------------------------------------------------------------------------------------------------------------------ Chemistries  Recent Labs  Lab 05/20/18 0000  NA 141  K 4.1  CL 106  CO2 26  GLUCOSE 107*  BUN 23  CREATININE 1.01  CALCIUM 8.9  AST 44*  ALT 10  ALKPHOS 90  BILITOT 0.9   ------------------------------------------------------------------------------------------------------------------  Cardiac Enzymes Recent Labs  Lab 05/20/18 0000  TROPONINI <0.03   ------------------------------------------------------------------------------------------------------------------  RADIOLOGY:  Dg Chest Portable 1 View  Result Date: 05/20/2018 CLINICAL DATA:  85 year old male with cough and hypoxia. EXAM: PORTABLE CHEST 1 VIEW COMPARISON:  09/03/2017 and earlier. FINDINGS: Portable AP upright view at 0003 hours. Chronic severe upper lung disease with bronchiectasis, architectural distortion as seen by CT in 2019. Increased superimposed consolidation/opacification of the right upper lobe since September. Similar new confluent opacity at the right lung base. Underlying coarse bilateral pulmonary interstitial opacity, with stable honeycombing at the left lung base. Stable cardiac size and mediastinal contours. Visualized tracheal air column is within normal limits. No pneumothorax. Possible small right pleural effusion. IMPRESSION: Severe chronic lung disease with superimposed acute right upper lobe and lower lobe consolidation  suspicious for multifocal acute infectious exacerbation. Possible small right pleural  effusion. Electronically Signed   By: Odessa FlemingH  Hall M.D.   On: 05/20/2018 00:39     ASSESSMENT AND PLAN:   85 year old elderly male patient with history of COPD, sciatica, atrial fibrillation currently under hospitalist service for pneumonia  -Healthcare associated pneumonia Continue IV vancomycin and cefepime antibiotics Oxygen via nasal cannula COVID-19 test is negative  -Hypertension Continue amlodipine for control of blood pressure  -Emphysema Inhaled steroids and albuterol inhalers  -Chronic atrial fibrillation In view of high risk for falls not on anticoagulation  -DVT prophylaxis subcu Lovenox daily  All the records are reviewed and case discussed with Care Management/Social Worker. Management plans discussed with the patient, family and they are in agreement.  CODE STATUS: DNR  DVT Prophylaxis: SCDs  TOTAL TIME TAKING CARE OF THIS PATIENT: 45 minutes.   POSSIBLE D/C IN 2 to 3 DAYS, DEPENDING ON CLINICAL CONDITION.  Ihor AustinPavan  M.D on 05/20/2018 at 11:13 AM  Between 7am to 6pm - Pager - [phone]  After 6pm go to [email or web address] - password [email or web address]  [facility] [place] [facility]  Office  [phone]  CC: Primary care physician; Patient, No Pcp Per  Note: This dictation was prepared with Dragon dictation along with smaller phrase technology. Any transcriptional errors that result from this process are unintentional.

## 2018-05-20 NOTE — ED Notes (Signed)
Pt 88% on 2L. Pt currently on 4L nasal cannula with oxygen saturation at 96%. MD Don Perking aware.

## 2018-05-20 NOTE — Plan of Care (Signed)

## 2018-05-20 NOTE — ED Notes (Signed)
.. ED TO INPATIENT HANDOFF REPORT  ED Nurse Name and Phone #: Pattricia Boss [phone]  S Name/Age/Gender Donald Blanchard 85 y.o. male Room/Bed: ED11A/ED11A  Code Status   Code Status: Prior  Home/SNF/Other Skilled nursing facility Patient oriented to: self, place, time and situation Is this baseline? Yes   Triage Complete: Triage complete  Chief Complaint SOB  Triage Note Pt presents from [facility] via acems with c/o low oxygenation saturation. Pt diagnosed with pneumonia at facility today. pt has had low oxygen all day according to facility. Pt was rpeorted to be 85% on 2L. Pt wears 2L chronically. EMS placed pt on 6L and oxygen saturation came up to 98%. 20G in RAC placed ems. hx of copd, emphysema. Pt 86% on room air upon arrival to ED.    Allergies No Known Allergies  Level of Care/Admitting Diagnosis ED Disposition    ED Disposition Condition Comment   Admit  Hospital Area: [facility] [[id number]]  Level of Care: Med-Surg [16]  Covid Evaluation: Screening Protocol (No Symptoms)  Diagnosis: HCAP (healthcare-associated pneumonia) [161096]  Admitting Physician: Arnaldo Natal [[id number]]  Attending Physician: Arnaldo Natal [[id number]]  Estimated length of stay: past midnight tomorrow  Certification:: I certify this patient will need inpatient services for at least 2 midnights  PT Class (Do Not Modify): Inpatient [101]  PT Acc Code (Do Not Modify): Private [1]       B Medical/Surgery History Past Medical History:  Diagnosis Date  . A-fib (HCC)   . COPD (chronic obstructive pulmonary disease) (HCC)    not on home o2  . Emphysema of lung (HCC)   . Sciatica    Past Surgical History:  Procedure Laterality Date  . NEPHRECTOMY     Left- secondary to trauma  . right lung lesion removal       A IV Location/Drains/Wounds Patient Lines/Drains/Airways Status   Active Line/Drains/Airways    Name:   Placement date:   Placement time:   Site:   Days:    Peripheral IV 05/27/18 Right Antecubital   05/27/2018    2359    Antecubital   1   Peripheral IV 05/20/18 Right Forearm   05/20/18    0052    Forearm   less than 1   Pressure Injury 09/30/17 Stage I -  Intact skin with non-blanchable redness of a localized area usually over a bony prominence.   09/30/17    1833     232          Intake/Output Last 24 hours  Intake/Output Summary (Last 24 hours) at 05/20/2018 0228 Last data filed at 05/20/2018 0201 Gross per 24 hour  Intake 300 ml  Output -  Net 300 ml    Labs/Imaging Results for orders placed or performed during the hospital encounter of May 27, 2018 (from the past 48 hour(s))  CBC with Differential/Platelet     Status: Abnormal   Collection Time: 05/20/18 12:00 AM  Result Value Ref Range   WBC 12.6 (H) 4.0 - 10.5 K/uL   RBC 3.90 (L) 4.22 - 5.81 MIL/uL   Hemoglobin 12.3 (L) 13.0 - 17.0 g/dL   HCT 29.5 (L) 62.1 - 30.8 %   MCV 96.9 80.0 - 100.0 fL   MCH 31.5 26.0 - 34.0 pg   MCHC 32.5 30.0 - 36.0 g/dL   RDW 65.7 (H) 84.6 - 96.2 %   Platelets 271 150 - 400 K/uL   nRBC 0.0 0.0 - 0.2 %   Neutrophils Relative %  75 %   Neutro Abs 9.5 (H) 1.7 - 7.7 K/uL   Lymphocytes Relative 10 %   Lymphs Abs 1.3 0.7 - 4.0 K/uL   Monocytes Relative 13 %   Monocytes Absolute 1.6 (H) 0.1 - 1.0 K/uL   Eosinophils Relative 1 %   Eosinophils Absolute 0.1 0.0 - 0.5 K/uL   Basophils Relative 0 %   Basophils Absolute 0.0 0.0 - 0.1 K/uL   Immature Granulocytes 1 %   Abs Immature Granulocytes 0.06 0.00 - 0.07 K/uL    Comment: Performed at [facility], [address] [place], [place] [address]  Comprehensive metabolic panel     Status: Abnormal   Collection Time: 05/20/18 12:00 AM  Result Value Ref Range   Sodium 141 135 - 145 mmol/L   Potassium 4.1 3.5 - 5.1 mmol/L   Chloride 106 98 - 111 mmol/L   CO2 26 22 - 32 mmol/L   Glucose, Bld 107 (H) 70 - 99 mg/dL   BUN 23 8 - 23 mg/dL   Creatinine, Ser 6.041.01 0.61 - 1.24 mg/dL   Calcium 8.9 8.9 - 54.010.3  mg/dL   Total Protein 6.8 6.5 - 8.1 g/dL   Albumin 3.2 (L) 3.5 - 5.0 g/dL   AST 44 (H) 15 - 41 U/L   ALT 10 0 - 44 U/L   Alkaline Phosphatase 90 38 - 126 U/L   Total Bilirubin 0.9 0.3 - 1.2 mg/dL   GFR calc non Af Amer >60 >60 mL/min   GFR calc Af Amer >60 >60 mL/min   Anion gap 9 5 - 15    Comment: Performed at [facility], [address] [place], [place] [address]  Lactic acid, plasma     Status: None   Collection Time: 05/20/18 12:00 AM  Result Value Ref Range   Lactic Acid, Venous 1.3 0.5 - 1.9 mmol/L    Comment: Performed at [facility], [address] [place], [place] [address]  Troponin I - ONCE - STAT     Status: None   Collection Time: 05/20/18 12:00 AM  Result Value Ref Range   Troponin I <0.03 <0.03 ng/mL    Comment: Performed at [facility], [address] [place], [place] [address]  SARS Coronavirus 2 (CEPHEID- Performed in [facility] hospital lab), Hosp Order     Status: None   Collection Time: 05/20/18 12:04 AM  Result Value Ref Range   SARS Coronavirus 2 NEGATIVE NEGATIVE    Comment: (NOTE) If result is NEGATIVE SARS-CoV-2 target nucleic acids are NOT DETECTED. The SARS-CoV-2 RNA is generally detectable in upper and lower  respiratory specimens during the acute phase of infection. The lowest  concentration of SARS-CoV-2 viral copies this assay can detect is 250  copies / mL. A negative result does not preclude SARS-CoV-2 infection  and should not be used as the sole basis for treatment or other  patient management decisions.  A negative result may occur with  improper specimen collection / handling, submission of specimen other  than nasopharyngeal swab, presence of viral mutation(s) within the  areas targeted by this assay, and inadequate number of viral copies  (<250 copies / mL). A negative result must be combined with clinical  observations, patient history, and epidemiological information. If result is  POSITIVE SARS-CoV-2 target nucleic acids are DETECTED. The SARS-CoV-2 RNA is generally detectable in upper and lower  respiratory specimens dur ing the acute phase of infection.  Positive  results are indicative  of active infection with SARS-CoV-2.  Clinical  correlation with patient history and other diagnostic information is  necessary to determine patient infection status.  Positive results do  not rule out bacterial infection or co-infection with other viruses. If result is PRESUMPTIVE POSTIVE SARS-CoV-2 nucleic acids MAY BE PRESENT.   A presumptive positive result was obtained on the submitted specimen  and confirmed on repeat testing.  While 2019 novel coronavirus  (SARS-CoV-2) nucleic acids may be present in the submitted sample  additional confirmatory testing may be necessary for epidemiological  and / or clinical management purposes  to differentiate between  SARS-CoV-2 and other Sarbecovirus currently known to infect humans.  If clinically indicated additional testing with an alternate test  methodology [phone]) is advised. The SARS-CoV-2 RNA is generally  detectable in upper and lower respiratory sp ecimens during the acute  phase of infection. The expected result is Negative. Fact Sheet for Patients:  BoilerBrush.com.cy Fact Sheet for Healthcare Providers: https://pope.com/ This test is not yet approved or cleared by the [place] FDA and has been authorized for detection and/or diagnosis of SARS-CoV-2 by FDA under an Emergency Use Authorization (EUA).  This EUA will remain in effect (meaning this test can be used) for the duration of the COVID-19 declaration under Section 564(b)(1) of the Act, 21 U.S.C. section 360bbb-3(b)(1), unless the authorization is terminated or revoked sooner. Performed at [facility], [address], [place], [place] [address]    Dg Chest Portable 1 View  Result Date:  05/20/2018 CLINICAL DATA:  85 year old male with cough and hypoxia. EXAM: PORTABLE CHEST 1 VIEW COMPARISON:  09/03/2017 and earlier. FINDINGS: Portable AP upright view at 0003 hours. Chronic severe upper lung disease with bronchiectasis, architectural distortion as seen by CT in 2019. Increased superimposed consolidation/opacification of the right upper lobe since September. Similar new confluent opacity at the right lung base. Underlying coarse bilateral pulmonary interstitial opacity, with stable honeycombing at the left lung base. Stable cardiac size and mediastinal contours. Visualized tracheal air column is within normal limits. No pneumothorax. Possible small right pleural effusion. IMPRESSION: Severe chronic lung disease with superimposed acute right upper lobe and lower lobe consolidation suspicious for multifocal acute infectious exacerbation. Possible small right pleural effusion. Electronically Signed   By: Odessa Fleming M.D.   On: 05/20/2018 00:39    Pending Labs Unresulted Labs (From admission, onward)    Start     Ordered   09-Jun-2018 2358  Blood culture (routine x 2)  BLOOD CULTURE X 2,   STAT     2018-06-09 2358   Signed and Held  Creatinine, serum  (enoxaparin (LOVENOX)    CrCl >/= 30 ml/min)  Weekly,   R    Comments:  while on enoxaparin therapy    Signed and Held   Signed and Held  TSH  Add-on,   R     Signed and Held   Signed and Held  Hemoglobin A1c  Add-on,   R     Signed and Held          Vitals/Pain Today's Vitals   05/20/18 0000 05/20/18 0030 05/20/18 0100 05/20/18 0201  BP: 124/88 (!) 120/57 (!) 122/53 (!) 154/74  Pulse: 74 69 70 82  Resp: Temp:      TempSrc:      SpO2: 96% 96% 96% (!) 88%  Weight:      Height:      PainSc:  Isolation Precautions Droplet and Contact precautions  Medications Medications  vancomycin (VANCOCIN) 500 mg in sodium chloride 0.9 % 100 mL IVPB (has no administration in time range)  ceFEPIme (MAXIPIME) 1 g in sodium  chloride 0.9 % 100 mL IVPB (0 g Intravenous Stopped 05/20/18 0141)  vancomycin (VANCOCIN) IVPB 1000 mg/200 mL premix (0 mg Intravenous Stopped 05/20/18 0201)    Mobility non-ambulatory Moderate fall risk   Focused Assessments Pulmonary Assessment Handoff:  Lung sounds: L Breath Sounds: Expiratory wheezes, Coarse crackles R Breath Sounds: Coarse crackles, Expiratory wheezes O2 Device: Nasal Cannula O2 Flow Rate (L/min): 3 L/min      R Recommendations: See Admitting Provider Note  Report given to:   Additional Notes:

## 2018-05-20 NOTE — Progress Notes (Signed)
Pharmacy Antibiotic Note  Donald Blanchard is a 85 y.o. male admitted on 05/18/2018 with pneumonia. Patient admitted via EMS from [facility]. Patient has we cough. Chest X-ray as an outpatient and in the Emergency Department positive for multifocal pneumonemia. Pharmacy has been consulted for cefepime dosing.  Plan: Continue cefepime 2g IV Q12hr. Will obtain follow up serum creatinine with am labs. Procalcitonin is currently negative (< 0.1). Recommend narrowing antibiotic therapy as appropriate.   Vancomycin discontinued secondary to negative MRSA PCR in consultation with MD.   Height: 6\' 2"  (188 cm) Weight: 135 lb (61.2 kg) IBW/kg (Calculated) : 82.2  Temp (24hrs), Avg:97.8 F (36.6 C), Min:97.7 F (36.5 C), Max:97.8 F (36.6 C)  Recent Labs  Lab 05/20/18 0000  WBC 12.6*  CREATININE 1.01  LATICACIDVEN 1.3    Estimated Creatinine Clearance: 46.3 mL/min (by C-G formula based on SCr of 1.01 mg/dL).    No Known Allergies  Antimicrobials this admission: Vancomycin 5/19 >> 5/19 Cefepime 5/19 >>   Dose adjustments this admission: N/A  Microbiology results: 5/19 BCx: no growth < 12 hours  5/19 COVID: negative  5/19 MRSA PCR: negative   Thank you for allowing pharmacy to be a part of this patient's care.  Simpson,Michael L 05/20/2018 3:01 PM

## 2018-05-21 ENCOUNTER — Inpatient Hospital Stay: Payer: Medicare Other

## 2018-05-21 LAB — CBC
HCT: 41.6 % (ref 39.0–52.0)
Hemoglobin: 13.3 g/dL (ref 13.0–17.0)
MCH: 31.4 pg (ref 26.0–34.0)
MCHC: 32 g/dL (ref 30.0–36.0)
MCV: 98.1 fL (ref 80.0–100.0)
Platelets: 289 10*3/uL (ref 150–400)
RBC: 4.24 MIL/uL (ref 4.22–5.81)
RDW: 20.7 % — ABNORMAL HIGH (ref 11.5–15.5)
WBC: 14.2 10*3/uL — ABNORMAL HIGH (ref 4.0–10.5)
nRBC: 0.1 % (ref 0.0–0.2)

## 2018-05-21 LAB — BASIC METABOLIC PANEL
Anion gap: 10 (ref 5–15)
BUN: 25 mg/dL — ABNORMAL HIGH (ref 8–23)
CO2: 24 mmol/L (ref 22–32)
Calcium: 9 mg/dL (ref 8.9–10.3)
Chloride: 107 mmol/L (ref 98–111)
Creatinine, Ser: 0.82 mg/dL (ref 0.61–1.24)
GFR calc Af Amer: >60 mL/min (ref >60)
GFR calc non Af Amer: >60 mL/min (ref >60)
Glucose, Bld: 104 mg/dL — ABNORMAL HIGH (ref 70–99)
Potassium: 3.5 mmol/L (ref 3.5–5.1)
Sodium: 141 mmol/L (ref 135–145)

## 2018-05-21 MED ORDER — CLINDAMYCIN PHOSPHATE 600 MG/50ML IV SOLN
600.0000 mg | Freq: Three times a day (TID) | INTRAVENOUS | Status: DC
  Administered 2018-05-21 – 2018-05-22 (×2): 600 mg via INTRAVENOUS
  Filled 2018-05-21 (×5): qty 50

## 2018-05-21 MED ORDER — MORPHINE SULFATE (PF) 2 MG/ML IV SOLN
1.0000 mg | Freq: Once | INTRAVENOUS | Status: AC
  Administered 2018-05-21: 1 mg via INTRAVENOUS
  Filled 2018-05-21: qty 1

## 2018-05-21 MED ORDER — POTASSIUM CHLORIDE CRYS ER 20 MEQ PO TBCR
20.0000 meq | EXTENDED_RELEASE_TABLET | Freq: Once | ORAL | Status: AC
  Administered 2018-05-21: 20 meq via ORAL
  Filled 2018-05-21: qty 1

## 2018-05-21 MED ORDER — QUETIAPINE FUMARATE 25 MG PO TABS
12.5000 mg | ORAL_TABLET | Freq: Two times a day (BID) | ORAL | Status: DC
  Administered 2018-05-21 – 2018-05-24 (×5): 12.5 mg via ORAL
  Filled 2018-05-21 (×5): qty 1

## 2018-05-21 MED ORDER — HALOPERIDOL LACTATE 5 MG/ML IJ SOLN
2.0000 mg | Freq: Once | INTRAMUSCULAR | Status: AC
  Administered 2018-05-21: 06:00:00 2 mg via INTRAVENOUS
  Filled 2018-05-21: qty 1

## 2018-05-21 MED ORDER — DIPHENHYDRAMINE HCL 50 MG/ML IJ SOLN
12.5000 mg | Freq: Once | INTRAMUSCULAR | Status: AC
  Administered 2018-05-21: 06:00:00 12.5 mg via INTRAVENOUS
  Filled 2018-05-21: qty 1

## 2018-05-21 MED ORDER — FUROSEMIDE 10 MG/ML IJ SOLN
40.0000 mg | Freq: Once | INTRAMUSCULAR | Status: AC
  Administered 2018-05-21: 40 mg via INTRAVENOUS
  Filled 2018-05-21: qty 4

## 2018-05-21 NOTE — Progress Notes (Signed)
[facility] - [place] at [facility]   PATIENT NAME: Donald Blanchard    MR#:  [id number]  DATE OF BIRTH:  09-23-1930  SUBJECTIVE:  CHIEF COMPLAINT:   Chief Complaint  Patient presents with  . Shortness of Breath  Patient seen and evaluated today Has shortness of breath and congestion Has occasional cough COVID-19 test negative  REVIEW OF SYSTEMS:    ROS  CONSTITUTIONAL: No documented fever. Has fatigue, weakness. No weight gain, no weight loss.  EYES: No blurry or double vision.  ENT: No tinnitus. No postnasal drip. No redness of the oropharynx.  RESPIRATORY: Has cough, no wheeze, no hemoptysis. Has dyspnea.  CARDIOVASCULAR: No chest pain. No orthopnea. No palpitations. No syncope.  GASTROINTESTINAL: No nausea, no vomiting or diarrhea. No abdominal pain. No melena or hematochezia.  GENITOURINARY: No dysuria or hematuria.  ENDOCRINE: No polyuria or nocturia. No heat or cold intolerance.  HEMATOLOGY: No anemia. No bruising. No bleeding.  INTEGUMENTARY: No rashes. No lesions.  MUSCULOSKELETAL: No arthritis. No swelling. No gout.  NEUROLOGIC: No numbness, tingling, or ataxia. No seizure-type activity.  PSYCHIATRIC: No anxiety. No insomnia. No ADD.   DRUG ALLERGIES:  No Known Allergies  VITALS:  Blood pressure (!) 105/57, pulse (!) 59, temperature (!) 97.5 F (36.4 C), temperature source Oral, resp. rate 20, height 6\' 2"  (1.88 m), weight 65.9 kg, SpO2 94 %.  PHYSICAL EXAMINATION:   Physical Exam  GENERAL:  85 y.o.-year-old patient lying in the bed with no acute distress.  EYES: Pupils equal, round, reactive to light and accommodation. No scleral icterus. Extraocular muscles intact.  HEENT: Head atraumatic, normocephalic. Oropharynx and nasopharynx clear.  NECK:  Supple, no jugular venous distention. No thyroid enlargement, no tenderness.  LUNGS: Decreased breath sounds bilaterally, rales heard in right lung. No use of accessory muscles of respiration.   CARDIOVASCULAR: S1, S2 normal. No murmurs, rubs, or gallops.  ABDOMEN: Soft, nontender, nondistended. Bowel sounds present. No organomegaly or mass.  EXTREMITIES: No cyanosis, clubbing or edema b/l.    NEUROLOGIC: Cranial nerves II through XII are intact. No focal Motor or sensory deficits b/l.   PSYCHIATRIC: The patient is alert and oriented x 3.  SKIN: No obvious rash, lesion, or ulcer.   LABORATORY PANEL:   CBC Recent Labs  Lab 05/21/18 0413  WBC 14.2*  HGB 13.3  HCT 41.6  PLT 289   ------------------------------------------------------------------------------------------------------------------ Chemistries  Recent Labs  Lab 05/20/18 0000 05/21/18 0413  NA 141 141  K 4.1 3.5  CL 106 107  CO2 26 24  GLUCOSE 107* 104*  BUN 23 25*  CREATININE 1.01 0.82  CALCIUM 8.9 9.0  AST 44*  --   ALT 10  --   ALKPHOS 90  --   BILITOT 0.9  --    ------------------------------------------------------------------------------------------------------------------  Cardiac Enzymes Recent Labs  Lab 05/20/18 0000  TROPONINI <0.03   ------------------------------------------------------------------------------------------------------------------  RADIOLOGY:  Dg Chest Portable 1 View  Result Date: 05/20/2018 CLINICAL DATA:  85 year old male with cough and hypoxia. EXAM: PORTABLE CHEST 1 VIEW COMPARISON:  09/03/2017 and earlier. FINDINGS: Portable AP upright view at 0003 hours. Chronic severe upper lung disease with bronchiectasis, architectural distortion as seen by CT in 2019. Increased superimposed consolidation/opacification of the right upper lobe since September. Similar new confluent opacity at the right lung base. Underlying coarse bilateral pulmonary interstitial opacity, with stable honeycombing at the left lung base. Stable cardiac size and mediastinal contours. Visualized tracheal air column is within normal limits. No pneumothorax. Possible small right  pleural effusion.  IMPRESSION: Severe chronic lung disease with superimposed acute right upper lobe and lower lobe consolidation suspicious for multifocal acute infectious exacerbation. Possible small right pleural effusion. Electronically Signed   By: Odessa FlemingH  Hall M.D.   On: 05/20/2018 00:39     ASSESSMENT AND PLAN:   85 year old elderly male patient with history of COPD, sciatica, atrial fibrillation currently under hospitalist service for pneumonia  -Healthcare associated pneumonia Improving slowly MRSA PCR negative Discontinue vancomycin IV Continue IVcefepime antibiotics Oxygen via nasal cannula COVID-19 test is negative  -Hypertension Continue amlodipine for control of blood pressure  -Emphysema Inhaled steroids and albuterol inhalers  -Chronic atrial fibrillation In view of high risk for falls not on anticoagulation  -DVT prophylaxis subcu Lovenox daily  -Delirium/agitation Start oral Seroquel  All the records are reviewed and case discussed with Care Management/Social Worker. Management plans discussed with the patient, family and they are in agreement.  CODE STATUS: DNR  DVT Prophylaxis: SCDs  TOTAL TIME TAKING CARE OF THIS PATIENT: 38 minutes.   POSSIBLE D/C IN 2 to 3 DAYS, DEPENDING ON CLINICAL CONDITION.  Ihor AustinPavan Ryane Canavan M.D on 05/21/2018 at 12:14 PM  Between 7am to 6pm - Pager - [phone]  After 6pm go to [email or web address] - password [email or web address]  [facility] [place] [facility]  Office  [phone]  CC: Primary care physician; Patient, No Pcp Per  Note: This dictation was prepared with Dragon dictation along with smaller phrase technology. Any transcriptional errors that result from this process are unintentional.

## 2018-05-21 NOTE — TOC Initial Note (Signed)
Transition of Care [facility](TOC) - Initial/Assessment Note    Patient Details  Name: Donald LeveringJohn Blanchard MRN: [id number] Date of Birth: 08/01/1930  Transition of Care [facility](TOC) CM/SW Contact:    Donald Blanchard  Michaeljohn Biss, LCSWA Phone Number: 05/21/2018, 10:22 AM  Clinical Narrative: CSW noted in chart review that patient is from Assisted living facility. CSW attempted to see patient but he is confused. CSW contacted patient's daughter Donald Blanchard. Daughter reports that patient lives at [facility]. Per daughter patient is wheelchair bound and is unable to walk. She also states that he is under Palliative care through [facility]. Daughter would like patient to return to [facility] when ready for discharge. She would like him transported by [facility] facility at that time. CSW will continue to follow for discharge planning.                   Expected Discharge Plan: Assisted Living Barriers to Discharge: Continued Medical Work up   Patient Goals and CMS Choice Patient states their goals for this hospitalization and ongoing recovery are:: return to ALF  CMS Medicare.gov Compare Post Acute Care list provided to:: Patient Represenative (must comment)(Daughter- Donald Bonynthia Blanchard ) Choice offered to / list presented to : Adult Children  Expected Discharge Plan and Services Expected Discharge Plan: Assisted Living       Living arrangements for the past 2 months: Assisted Living Facility                                      Prior Living Arrangements/Services Living arrangements for the past 2 months: Assisted Living Facility Lives with:: Facility Resident Patient language and need for interpreter reviewed:: Yes Do you feel safe going back to the place where you live?: Yes      Need for Family Participation in Patient Care: Yes (Comment) Care giver support system in place?: Yes (comment) Current home services: Other (comment)(Palliative) Criminal Activity/Legal Involvement Pertinent to Current  Situation/Hospitalization: No - Comment as needed  Activities of Daily Living Home Assistive Devices/Equipment: Wheelchair ADL Screening (condition at time of admission) Patient's cognitive ability adequate to safely complete daily activities?: No Is the patient deaf or have difficulty hearing?: No Does the patient have difficulty seeing, even when wearing glasses/contacts?: No Does the patient have difficulty concentrating, remembering, or making decisions?: Yes Patient able to express need for assistance with ADLs?: Yes Does the patient have difficulty dressing or bathing?: Yes Independently performs ADLs?: No Communication: Independent Dressing (OT): Needs assistance Is this a change from baseline?: Pre-admission baseline Grooming: Needs assistance Is this a change from baseline?: Pre-admission baseline Feeding: Needs assistance Is this a change from baseline?: Pre-admission baseline Bathing: Needs assistance Is this a change from baseline?: Pre-admission baseline Toileting: Needs assistance Is this a change from baseline?: Pre-admission baseline In/Out Bed: Needs assistance Is this a change from baseline?: Pre-admission baseline Walks in Home: Needs assistance Is this a change from baseline?: Pre-admission baseline Does the patient have difficulty walking or climbing stairs?: Yes Weakness of Legs: None Weakness of Arms/Hands: None  Permission Sought/Granted Permission sought to share information with : Case Manager, Magazine features editoracility Contact Representative, Family Supports Permission granted to share information with : Yes, Verbal Permission Granted              Emotional Assessment Appearance:: Appears stated age Attitude/Demeanor/Rapport: Lethargic   Orientation: : Oriented to Self Alcohol / Substance Use: Not Applicable Psych Involvement: No (  comment)  Admission diagnosis:  HCAP (healthcare-associated pneumonia) [J18.9] Acute on chronic respiratory failure with hypoxia  (HCC) [J96.21] Patient Active Problem List   Diagnosis Date Noted  . HCAP (healthcare-associated pneumonia) 05/20/2018  . Memory deficit 02/04/2018  . Weakness generalized 02/04/2018  . Memory loss 12/10/2017  . Unsteady gait 12/10/2017  . Anorexia 12/10/2017  . Palliative care encounter 12/10/2017  . Protein-calorie malnutrition, severe 10/02/2017  . Pressure injury of skin 10/01/2017  . Altered mental status 01/04/2017  . Right upper lobe pneumonia (HCC) 01/08/2015  . Emphysema lung (HCC) 01/08/2015  . Leukocytosis 01/08/2015  . Anemia 01/08/2015  . Atrial fibrillation (HCC) 01/08/2015  . Dementia with behavioral disturbance (HCC) 01/08/2015  . Dysphagia 01/08/2015  . Sepsis (HCC) 01/06/2015   PCP:  Patient, No Pcp Per Pharmacy:  No Pharmacies Listed    Social Determinants of Health (SDOH) Interventions    Readmission Risk Interventions No flowsheet data found.

## 2018-05-21 NOTE — NC FL2 (Signed)
MEDICAID FL2 LEVEL OF CARE SCREENING TOOL     IDENTIFICATION  Patient Name: Donald Blanchard Birthdate: February 17, 1930 Sex: male Admission Date (Current Location): May 21, 2018  [facility] and [place] Number:  Chiropodist and Address:  [facility], [address] [place], [place] [address]      Provider Number: [id number]  Attending Physician Name and Address:  Ihor Austin, MD  Relative Name and Phone Number:       Current Level of Care: Hospital Recommended Level of Care: Assisted Living Facility Prior Approval Number:    Date Approved/Denied:   PASRR Number:    Discharge Plan: Domiciliary (Rest home)    Current Diagnoses: Patient Active Problem List   Diagnosis Date Noted  . HCAP (healthcare-associated pneumonia) 05/20/2018  . Memory deficit 02/04/2018  . Weakness generalized 02/04/2018  . Memory loss 12/10/2017  . Unsteady gait 12/10/2017  . Anorexia 12/10/2017  . Palliative care encounter 12/10/2017  . Protein-calorie malnutrition, severe 10/02/2017  . Pressure injury of skin 10/01/2017  . Altered mental status 01/04/2017  . Right upper lobe pneumonia (HCC) 01/08/2015  . Emphysema lung (HCC) 01/08/2015  . Leukocytosis 01/08/2015  . Anemia 01/08/2015  . Atrial fibrillation (HCC) 01/08/2015  . Dementia with behavioral disturbance (HCC) 01/08/2015  . Dysphagia 01/08/2015  . Sepsis (HCC) 01/06/2015    Orientation RESPIRATION BLADDER Height & Weight     Self  O2(4 liters ) Incontinent Weight: 145 lb 4.5 oz (65.9 kg) Height:  6\' 2"  (188 cm)  BEHAVIORAL SYMPTOMS/MOOD NEUROLOGICAL BOWEL NUTRITION STATUS  (none) (none) Incontinent Diet(regular diet )  AMBULATORY STATUS COMMUNICATION OF NEEDS Skin   Limited Assist Verbally Normal                       Personal Care Assistance Level of Assistance  Bathing, Feeding, Dressing Bathing Assistance: Limited assistance Feeding assistance: Independent Dressing Assistance:  Independent     Functional Limitations Info  Sight, Hearing, Speech Sight Info: Adequate Hearing Info: Adequate Speech Info: Adequate    SPECIAL CARE FACTORS FREQUENCY                       Contractures Contractures Info: Not present    Additional Factors Info  Code Status, Allergies Code Status Info: DNR Allergies Info: NKA           Current Medications (05/21/2018):  This is the current hospital active medication list Current Facility-Administered Medications  Medication Dose Route Frequency Provider Last Rate Last Dose  . 0.9 %  sodium chloride infusion   Intravenous Continuous Ihor Austin, MD   Stopped at 05/21/18 1010  . acetaminophen (TYLENOL) tablet 650 mg  650 mg Oral Q6H PRN Arnaldo Natal, MD   650 mg at 05/20/18 2318   Or  . acetaminophen (TYLENOL) suppository 650 mg  650 mg Rectal Q6H PRN Arnaldo Natal, MD      . albuterol (PROVENTIL) (2.5 MG/3ML) 0.083% nebulizer solution 2.5 mg  2.5 mg Nebulization Q4H PRN Arnaldo Natal, MD      . amLODipine (NORVASC) tablet 2.5 mg  2.5 mg Oral Daily Arnaldo Natal, MD   2.5 mg at 05/21/18 0955  . ceFEPIme (MAXIPIME) 2 g in sodium chloride 0.9 % 100 mL IVPB  2 g Intravenous Q12H Ihor Austin, MD   Stopped at 05/21/18 0034  . docusate sodium (COLACE) capsule 100 mg  100 mg Oral BID Arnaldo Natal, MD  100 mg at 05/21/18 0955  . enoxaparin (LOVENOX) injection 40 mg  40 mg Subcutaneous Q24H Arnaldo Nataliamond, Michael S, MD   40 mg at 05/21/18 0955  . feeding supplement (ENSURE) (ENSURE) pudding 1 Container  1 Container Oral BID AC Arnaldo Nataliamond, Michael S, MD   1 Container at 05/20/18 1711  . MEDLINE mouth rinse  15 mL Mouth Rinse BID Pyreddy, Vivien RotaPavan, MD   15 mL at 05/21/18 1010  . meloxicam (MOBIC) tablet 7.5 mg  7.5 mg Oral Daily Arnaldo Nataliamond, Michael S, MD   7.5 mg at 05/21/18 [id number]  . mirtazapine (REMERON) tablet 7.5 mg  7.5 mg Oral QHS Arnaldo Nataliamond, Michael S, MD   7.5 mg at 05/20/18 2317  . multivitamin with minerals  tablet 1 tablet  1 tablet Oral Daily Arnaldo Nataliamond, Michael S, MD   1 tablet at 05/21/18 [phone]  . ondansetron (ZOFRAN) tablet 4 mg  4 mg Oral Q6H PRN Arnaldo Nataliamond, Michael S, MD       Or  . ondansetron [facility](ZOFRAN) injection 4 mg  4 mg Intravenous Q6H PRN Arnaldo Nataliamond, Michael S, MD      . pantoprazole (PROTONIX) EC tablet 40 mg  40 mg Oral Daily Arnaldo Nataliamond, Michael S, MD   40 mg at 05/21/18 0955  . tiotropium (SPIRIVA) inhalation capsule ([facility] use ONLY) 18 mcg  18 mcg Inhalation Daily Arnaldo Nataliamond, Michael S, MD   18 mcg at 05/21/18 0954  . traMADol (ULTRAM) tablet 50 mg  50 mg Oral BID Arnaldo Nataliamond, Michael S, MD   50 mg at 05/21/18 0955  . traZODone (DESYREL) tablet 25 mg  25 mg Oral QHS Arnaldo Nataliamond, Michael S, MD   25 mg at 05/20/18 2317  . vitamin C (ASCORBIC ACID) tablet 500 mg  500 mg Oral Daily Arnaldo Nataliamond, Michael S, MD   500 mg at 05/21/18 1010  . zinc sulfate capsule 220 mg  220 mg Oral Daily Arnaldo Nataliamond, Michael S, MD   220 mg at 05/21/18 [id number]     Discharge Medications: Please see discharge summary for a list of discharge medications.  Relevant Imaging Results:  Relevant Lab Results:   Additional Information    Khiem Gargis  Rinaldo RatelGarrison, [address]CSWA

## 2018-05-22 DIAGNOSIS — Z515 Encounter for palliative care: Secondary | ICD-10-CM

## 2018-05-22 DIAGNOSIS — J9621 Acute and chronic respiratory failure with hypoxia: Secondary | ICD-10-CM

## 2018-05-22 DIAGNOSIS — Z7189 Other specified counseling: Secondary | ICD-10-CM

## 2018-05-22 DIAGNOSIS — J189 Pneumonia, unspecified organism: Secondary | ICD-10-CM

## 2018-05-22 LAB — BASIC METABOLIC PANEL
Anion gap: 10 (ref 5–15)
BUN: 28 mg/dL — ABNORMAL HIGH (ref 8–23)
CO2: 25 mmol/L (ref 22–32)
Calcium: 8.9 mg/dL (ref 8.9–10.3)
Chloride: 106 mmol/L (ref 98–111)
Creatinine, Ser: 0.88 mg/dL (ref 0.61–1.24)
GFR calc Af Amer: >60 mL/min (ref >60)
GFR calc non Af Amer: >60 mL/min (ref >60)
Glucose, Bld: 77 mg/dL (ref 70–99)
Potassium: 3.8 mmol/L (ref 3.5–5.1)
Sodium: 141 mmol/L (ref 135–145)

## 2018-05-22 LAB — CBC
HCT: 39.1 % (ref 39.0–52.0)
Hemoglobin: 12.7 g/dL — ABNORMAL LOW (ref 13.0–17.0)
MCH: 31.2 pg (ref 26.0–34.0)
MCHC: 32.5 g/dL (ref 30.0–36.0)
MCV: 96.1 fL (ref 80.0–100.0)
Platelets: 279 10*3/uL (ref 150–400)
RBC: 4.07 MIL/uL — ABNORMAL LOW (ref 4.22–5.81)
RDW: 20.9 % — ABNORMAL HIGH (ref 11.5–15.5)
WBC: 13 10*3/uL — ABNORMAL HIGH (ref 4.0–10.5)
nRBC: 0 % (ref 0.0–0.2)

## 2018-05-22 MED ORDER — PIPERACILLIN-TAZOBACTAM 3.375 G IVPB
3.3750 g | Freq: Three times a day (TID) | INTRAVENOUS | Status: DC
  Administered 2018-05-22 – 2018-05-24 (×6): 3.375 g via INTRAVENOUS
  Filled 2018-05-22 (×6): qty 50

## 2018-05-22 MED ORDER — SODIUM CHLORIDE 0.9 % IV SOLN
INTRAVENOUS | Status: DC
  Administered 2018-05-22 – 2018-05-24 (×2): via INTRAVENOUS

## 2018-05-22 NOTE — Progress Notes (Signed)
[employer] - [place] [facility]   PATIENT NAME: Donald Blanchard    MR#:  [id number]  DATE OF BIRTH:  Jul 11, 1930  SUBJECTIVE:  CHIEF COMPLAINT:   Chief Complaint  Patient presents with  . Shortness of Breath  Patient seen and evaluated today Has shortness of breath and congestion On six liter oxygen via nasal canula Has occasional cough COVID-19 test negative  REVIEW OF SYSTEMS:    ROS  CONSTITUTIONAL: No documented fever. Has fatigue, weakness. No weight gain, no weight loss.  EYES: No blurry or double vision.  ENT: No tinnitus. No postnasal drip. No redness of the oropharynx.  RESPIRATORY: Has cough, no wheeze, no hemoptysis. Has dyspnea.  CARDIOVASCULAR: No chest pain. No orthopnea. No palpitations. No syncope.  GASTROINTESTINAL: No nausea, no vomiting or diarrhea. No abdominal pain. No melena or hematochezia.  GENITOURINARY: No dysuria or hematuria.  ENDOCRINE: No polyuria or nocturia. No heat or cold intolerance.  HEMATOLOGY: No anemia. No bruising. No bleeding.  INTEGUMENTARY: No rashes. No lesions.  MUSCULOSKELETAL: No arthritis. No swelling. No gout.  NEUROLOGIC: No numbness, tingling, or ataxia. No seizure-type activity.  PSYCHIATRIC: No anxiety. No insomnia. No ADD.   DRUG ALLERGIES:  No Known Allergies  VITALS:  Blood pressure 113/64, pulse 75, temperature 97.9 F (36.6 C), resp. rate 19, height 6\' 2"  (1.88 m), weight 63.4 kg, SpO2 91 %.  PHYSICAL EXAMINATION:   Physical Exam  GENERAL:  85 y.o.-year-old patient lying in the bed with no acute distress.  EYES: Pupils equal, round, reactive to light and accommodation. No scleral icterus. Extraocular muscles intact.  HEENT: Head atraumatic, normocephalic. Oropharynx and nasopharynx clear.  NECK:  Supple, no jugular venous distention. No thyroid enlargement, no tenderness.  LUNGS: Decreased breath sounds bilaterally, rales heard in right lung. No use of accessory muscles of respiration.   CARDIOVASCULAR: S1, S2 normal. No murmurs, rubs, or gallops.  ABDOMEN: Soft, nontender, nondistended. Bowel sounds present. No organomegaly or mass.  EXTREMITIES: No cyanosis, clubbing or edema b/l.    NEUROLOGIC: Cranial nerves II through XII are intact. No focal Motor or sensory deficits b/l.   PSYCHIATRIC: The patient is alert and oriented x 3.  SKIN: No obvious rash, lesion, or ulcer.   LABORATORY PANEL:   CBC Recent Labs  Lab 05/22/18 0404  WBC 13.0*  HGB 12.7*  HCT 39.1  PLT 279   ------------------------------------------------------------------------------------------------------------------ Chemistries  Recent Labs  Lab 05/20/18 0000  05/22/18 0404  NA 141   < > 141  K 4.1   < > 3.8  CL 106   < > 106  CO2 26   < > 25  GLUCOSE 107*   < > 77  BUN 23   < > 28*  CREATININE 1.01   < > 0.88  CALCIUM 8.9   < > 8.9  AST 44*  --   --   ALT 10  --   --   ALKPHOS 90  --   --   BILITOT 0.9  --   --    < > = values in this interval not displayed.   ------------------------------------------------------------------------------------------------------------------  Cardiac Enzymes Recent Labs  Lab 05/20/18 0000  TROPONINI <0.03   ------------------------------------------------------------------------------------------------------------------  RADIOLOGY:  Dg Chest Port 1 View  Result Date: 05/21/2018 CLINICAL DATA:  Emphysema COPD EXAM: PORTABLE CHEST 1 VIEW COMPARISON:  05/20/2018, 09/30/2017, 01/04/2017, CT 01/04/2017 FINDINGS: Emphysematous disease, fibrosis and bronchiectasis. Increasing right pleural and parenchymal process in the right thorax superimposed on underlying chronic  disease. Reticular infiltrate at the left base, stable since 09/30/2017. Stable cardiomediastinal silhouette with aortic atherosclerosis. Left apically pleural and parenchymal scarring as before. IMPRESSION: 1. Further worsening of right pleural and parenchymal process super imposed on  underlying chronic lung disease. 2. Overall no significant change in appearance of left thorax with apically pleural and parenchymal chronic change and reticular opacity at the left base. Electronically Signed   By: Jasmine PangKim  Fujinaga M.D.   On: 05/21/2018 17:03     ASSESSMENT AND PLAN:   85 year old elderly male patient with history of COPD, sciatica, atrial fibrillation currently under hospitalist service for pneumonia  -Acute respiratory distress with hypoxia Continue oxygen via nasal cannula Monitor oxygen saturation  -Healthcare associated pneumonia Has aspiration component Improving slowly MRSA PCR negative Discontinued vancomycin IV Continue IVcefepime antibiotics Started on IV clindamycin antibiotic to Oxygen via nasal cannula COVID-19 test is negative Leukocytosis improving  -Hypertension Continue amlodipine for control of blood pressure  -Emphysema Inhaled steroids and albuterol inhalers  -Chronic atrial fibrillation In view of high risk for falls not on anticoagulation  -DVT prophylaxis subcu Lovenox daily  -Delirium/agitation Start oral Seroquel  -Risk of aspiration Speech therapy to do swallow study and recommend diet  All the records are reviewed and case discussed with Care Management/Social Worker. Management plans discussed with the patient, family and they are in agreement.  CODE STATUS: DNR  DVT Prophylaxis: SCDs  TOTAL TIME TAKING CARE OF THIS PATIENT: 37 minutes.   POSSIBLE D/C IN 2 to 3 DAYS, DEPENDING ON CLINICAL CONDITION.  Ihor AustinPavan Pyreddy M.D on 05/22/2018 at 10:00 AM  Between 7am to 6pm - Pager - [phone]  After 6pm go to [email or web address] - password [email or web address]  [facility]  Office  [phone]  CC: Primary care physician; Patient, No Pcp Per  Note: This dictation was prepared with Dragon dictation along with smaller phrase technology. Any transcriptional errors that result from this process are unintentional.

## 2018-05-22 NOTE — Evaluation (Signed)
Clinical/Bedside Swallow Evaluation Patient Details  Name: Donald Blanchard MRN: [id number] Date of Birth: September 06, 1930  Today's Date: 05/22/2018 Time: SLP Start Time (ACUTE ONLY): 1014 SLP Stop Time (ACUTE ONLY): 1114 SLP Time Calculation (min) (ACUTE ONLY): 60 min  Past Medical History:  Past Medical History:  Diagnosis Date  . A-fib (HCC)   . COPD (chronic obstructive pulmonary disease) (HCC)    not on home o2  . Emphysema of lung (HCC)   . Sciatica    Past Surgical History:  Past Surgical History:  Procedure Laterality Date  . NEPHRECTOMY     Left- secondary to trauma  . right lung lesion removal     HPI:  H&P 05/28/2018: "The patient with past medical history of COPD and atrial fibrillation presents to the emergency department from his nursing home due to cough and increased oxygen requirement.  The patient is usually on 2 L of oxygen via nasal cannula and required 4 L to maintain oxygen saturations greater than 94%.  Chest x-ray as an outpatient and in the emergency department showed multifocal pneumonia.  At the time of arrival the patient was groggy and unable to provide history.  By the time this examiner interviewed the patient he was clearheaded and denied pain.  He does not believe that he has pneumonia but admits to a wet cough.  Denies chest pain.  The patient was started on IV antibiotics prior to the emergency department staff called the hospitalist service for admission."    The patient was noted to have worsening right pleural and parenchymal on CXR yesterday.   Assessment / Plan / Recommendation Clinical Impression  The patient appears to present with risk for aspiration with thin liquid.  He had an immediate cough with an ice chip.  He has a baseline cough, but the response to the melted ice chip was immediate and stronger than the baseline cough.  The patient was able to take pureed solid and nectar-thick liquid with no increase in cough frequency or intensity.  The  patient becomes slightly breathless with swallowing but appears to be protecting his airway.  He does not attempt to take another bite/sip until he is breathing better.  The patient is weak and required hand-over-hand to self-feed.  Recommend dysphagia 1 (puree) diet with nectar-thick liquids.  Patient will require assistance.  He should alternate liquids and solids.  He should catch his breath between bites/sips.  Stop feeding if baseline cough intensifies with swallowing.  SLP will follow for diet toleration.   SLP Visit Diagnosis: Dysphagia, oropharyngeal phase (R13.12)    Aspiration Risk  Moderate aspiration risk    Diet Recommendation Dysphagia 1 (Puree);Nectar-thick liquid   Liquid Administration via: Cup;No straw Medication Administration: Crushed with puree Supervision: Full supervision/cueing for compensatory strategies Compensations: Minimize environmental distractions;Slow rate;Small sips/bites;Follow solids with liquid;Other (Comment)(Stop feeding if cough intensifies) Postural Changes: Seated upright at 90 degrees;Remain upright for at least 30 minutes after po intake    Other  Recommendations Oral Care Recommendations: Oral care BID;Staff/trained caregiver to provide oral care   Follow up Recommendations Skilled Nursing facility      Frequency and Duration min 3x week  2 weeks       Prognosis Prognosis for Safe Diet Advancement: Good Barriers to Reach Goals: Severity of deficits      Swallow Study   General Date of Onset: 05/20/18 HPI: H&P 05/16/2018: "The patient with past medical history of COPD and atrial fibrillation presents to the emergency department from his nursing  home due to cough and increased oxygen requirement.  The patient is usually on 2 L of oxygen via nasal cannula and required 4 L to maintain oxygen saturations greater than 94%.  Chest x-ray as an outpatient and in the emergency department showed multifocal pneumonia.  At the time of arrival the patient  was groggy and unable to provide history.  By the time this examiner interviewed the patient he was clearheaded and denied pain.  He does not believe that he has pneumonia but admits to a wet cough.  Denies chest pain.  The patient was started on IV antibiotics prior to the emergency department staff called the hospitalist service for admission."  The patient was noted to have worsening right pleural and parenchymal on CXR yesterday. Type of Study: Bedside Swallow Evaluation Previous Swallow Assessment: None known Diet Prior to this Study: NPO;Other (Comment)(Regular prior to suspected aspiration) Temperature Spikes Noted: No Respiratory Status: Nasal cannula;Other (comment)(5L) History of Recent Intubation: No Behavior/Cognition: Alert;Cooperative;Pleasant mood Oral Cavity Assessment: Dry;Dried secretions Oral Cavity - Dentition: Adequate natural dentition Vision: Functional for self-feeding Self-Feeding Abilities: Needs assist Patient Positioning: Upright in bed Baseline Vocal Quality: Normal;Other (comment)(Baseline cough) Volitional Cough: Weak Volitional Swallow: Able to elicit    Oral/Motor/Sensory Function Overall Oral Motor/Sensory Function: Other (comment)(Very dry)   Ice Chips Ice chips: Impaired Presentation: Spoon Pharyngeal Phase Impairments: Cough - Immediate   Thin Liquid      Nectar Thick Nectar Thick Liquid: Within functional limits Presentation: Cup;Spoon Other Comments: No change in baseline cough   Honey Thick     Puree Puree: Within functional limits Presentation: Spoon Other Comments: No change in baseline cough   Solid           Donald PrimroseSusan Blanchard Donald Dantes, MS/CCC- SLP  Donald Blanchard, Donald Blanchard 05/22/2018,11:15 AM

## 2018-05-22 NOTE — Progress Notes (Signed)
Initial Nutrition Assessment  RD working remotely.  DOCUMENTATION CODES:   Underweight  INTERVENTION:  Provide chocolate Hormel Shake po TID with meals, each supplement provides 520 kcal and 22 grams of protein.  Continue daily MVI.   If patient's skin is truly intact, recommend discontinuing vitamin C and zinc.  NUTRITION DIAGNOSIS:   Increased nutrient needs related to catabolic illness(COPD) as evidenced by estimated needs.  GOAL:   Patient will meet greater than or equal to 90% of their needs  MONITOR:   PO intake, Supplement acceptance, Labs, Weight trends, I & O's  REASON FOR ASSESSMENT:   Other (Comment)(Low BMI)    ASSESSMENT:   85 year old male with PMHx of emphysema of lung, COPD, sciatica, A-fib admitted with PNA.   Patient unable to provide history at this time. Per chart he ate 50% of dinner on 5/19. He was then made NPO on 5/20 and SLP was consulted. Following SLP assessment this morning patient was placed on dysphagia 1 diet with nectar-thick liquids. Patient is known to this RD from a previous admission. He really enjoys chocolate milkshakes. Patient is from assisted living. He is wheelchair bound and unable to walk. He has been followed by palliative care there. Patient appears fairly weight-stable per chart. Suspect patient is still malnourished, but unable to determine without completing NFPE.  Medications reviewed and include: colace 100 mg BID, Remeron, MVI daily, pantoprazole, Seroquel, vitamin C 500 mg daily, zinc sulfate 220 mg daily, NS @ 50 mL/hr, cefepime, clindamycin.  Labs reviewed: BUN 28.  NUTRITION - FOCUSED PHYSICAL EXAM:  Unable to complete at this time.  Diet Order:   Diet Order            DIET - DYS 1 Room service appropriate? Yes; Fluid consistency: Nectar Thick  Diet effective now             EDUCATION NEEDS:   Not appropriate for education at this time  Skin:  Skin Assessment: Reviewed RN Assessment(skin is  intact)  Last BM:  05/20/2018 - small type 4  Height:   Ht Readings from Last 1 Encounters:  05/10/2018 6\' 2"  (1.88 m)   Weight:   Wt Readings from Last 1 Encounters:  05/22/18 63.4 kg   Ideal Body Weight:  86.4 kg  BMI:  Body mass index is 17.95 kg/m.  Estimated Nutritional Needs:   Kcal:  1700-1900  Protein:  75-85 grams  Fluid:  1.7-1.9 L/day  Helane Rima, MS, RD, LDN Office: [phone] Pager: [phone] After Hours/Weekend Pager: [phone]

## 2018-05-22 NOTE — Consult Note (Addendum)
Consultation Note Date: 05/22/2018   Patient Name: Donald Blanchard  DOB: 05-22-1930  MRN: [id number]  Age / Sex: 85 y.o., male  PCP: Patient, No Pcp Per Referring Physician: Saundra Shelling, MD  Reason for Consultation: Establishing goals of care  HPI/Patient Profile:  85 year old elderly male patient with history of COPD, sciatica, atrial fibrillation currently under hospitalist service for pneumonia.   Clinical Assessment and Goals of Care: Have met this patient previously, please see these notes. He is unable to speak with me.   Spoke with Caren Griffins. She states he was changed from his previously recommended dysphagia diet to a regular diet at the facility as he did not want the dysphagia diet, and when the facility discussed this with her, she approved of this plan. We discussed the risk of aspiration PNA, and current diagnosis. We discussed the option of a comfort path. She states she will tell him this time that he must stay on the dysphagia diet, and states if she tells him to, he will.    We discussed his diagnosis, prognosis, GOC, EOL wishes disposition and options.  A detailed discussion was had today regarding advanced directives.  Concepts specific to code status, artifical feeding and hydration, IV antibiotics and rehospitalization were discussed.  The difference between an aggressive medical intervention path and a comfort care path was discussed.  Values and goals of care important to patient and family were attempted to be elicited.  Discussed limitations of medical interventions to prolong quality of life in some situations and discussed the concept of human mortality.  She does not want a feeding tube or BIPAP. She states she understands where he is, and is at high risk for death. She confirms DNR/DNI. She would like to see how he does over the next few days as he is receiving antibiotics and could  improve. Discussed concern of reoccurence and QOL with the dysphagia diet if he has changed from this back to a regular diet in the past.   SUMMARY OF RECOMMENDATIONS   DNR/DNI No feeding tube.  No BIPAP   Prognosis:   Poor  Discharge Planning: To Be Determined      Primary Diagnoses: Present on Admission:  HCAP (healthcare-associated pneumonia)   I have reviewed the medical record, interviewed the patient and family, and examined the patient. The following aspects are pertinent.  Past Medical History:  Diagnosis Date   A-fib ([place])    COPD (chronic obstructive pulmonary disease) ([place])    not on home o2   Emphysema of lung ([place])    Sciatica    Social History   Socioeconomic History   Marital status: Married    Spouse name: Not on file   Number of children: Not on file   Years of education: Not on file   Highest education level: Not on file  Occupational History   Not on file  Social Needs   Financial resource strain: Not very hard   Food insecurity:    Worry: Never true    Inability: Never  true   Transportation needs:    Medical: No    Non-medical: No  Tobacco Use   Smoking status: Former Smoker    Types: Cigarettes    Last attempt to quit: 01/02/2004    Years since quitting: 14.3   Smokeless tobacco: Never Used   Tobacco comment: Quit 10 years ago  Substance and Sexual Activity   Alcohol use: No    Alcohol/week: 0.0 standard drinks   Drug use: No   Sexual activity: Never  Lifestyle   Physical activity:    Days per week: Not on file    Minutes per session: Not on file   Stress: Not on file  Relationships   Social connections:    Talks on phone: Not on file    Gets together: Not on file    Attends religious service: Not on file    Active member of club or organization: Not on file    Attends meetings of clubs or organizations: Not on file    Relationship status: Not on file  Other Topics Concern   Not on file  Social  History Narrative   Lives at home with wife. Has a walker to ambulate   Family History  Problem Relation Age of Onset   Hypertension Father    Scheduled Meds:  amLODipine  2.5 mg Oral Daily   docusate sodium  100 mg Oral BID   enoxaparin (LOVENOX) injection  40 mg Subcutaneous Q24H   mouth rinse  15 mL Mouth Rinse BID   meloxicam  7.5 mg Oral Daily   mirtazapine  7.5 mg Oral QHS   multivitamin with minerals  1 tablet Oral Daily   pantoprazole  40 mg Oral Daily   QUEtiapine  12.5 mg Oral BID   tiotropium  18 mcg Inhalation Daily   traMADol  50 mg Oral BID   traZODone  25 mg Oral QHS   vitamin C  500 mg Oral Daily   zinc sulfate  220 mg Oral Daily   Continuous Infusions:  sodium chloride 50 mL/hr at 05/22/18 1453   piperacillin-tazobactam (ZOSYN)  IV     PRN Meds:.acetaminophen **OR** acetaminophen, albuterol, ondansetron **OR** ondansetron (ZOFRAN) IV Medications Prior to Admission:  Prior to Admission medications   Medication Sig Start Date End Date Taking? Authorizing Provider  acetaminophen (TYLENOL) 325 MG tablet Take 650 mg by mouth every 6 (six) hours as needed for mild pain or fever.    Yes [provider]  albuterol (PROVENTIL) (2.5 MG/3ML) 0.083% nebulizer solution Take 2.5 mg by nebulization every 4 (four) hours as needed for shortness of breath (decreased O2 saturation).   Yes [provider]  amLODipine (NORVASC) 2.5 MG tablet Take 1 tablet (2.5 mg total) by mouth daily. 10/05/17  Yes Wieting, Richard, MD  feeding supplement, ENSURE, (ENSURE) PUDG Take 1 Container by mouth 2 (two) times a day. With lunch and dinner   Yes [provider]  meloxicam (MOBIC) 7.5 MG tablet Take 7.5 mg by mouth daily.   Yes [provider]  mirtazapine (REMERON) 7.5 MG tablet Take 7.5 mg by mouth at bedtime.   Yes [provider]  Multiple Vitamin (MULTIVITAMIN WITH MINERALS) TABS tablet Take 1 tablet by mouth daily.   Yes  [provider]  omeprazole (PRILOSEC) 20 MG capsule Take 20 mg by mouth daily.   Yes [provider]  tiotropium (SPIRIVA) 18 MCG inhalation capsule Place 18 mcg into inhaler and inhale daily.   Yes [provider]  traMADol (ULTRAM) 50 MG tablet Take 50 mg by mouth 2 (two) times daily. Hold if resident is sleepy   Yes [provider]  traZODone (DESYREL) 50 MG tablet Take 25 mg by mouth at bedtime.   Yes [provider]  vitamin C (ASCORBIC ACID) 500 MG tablet Take 500 mg by mouth daily.   Yes [provider]  zinc sulfate 220 (50 Zn) MG capsule Take 220 mg by mouth daily.   Yes [provider]   No Known Allergies Review of Systems  Unable to perform ROS   Physical Exam Constitutional:      Comments: Resting with eyes closed.   Pulmonary:     Effort: Pulmonary effort is normal.     Vital Signs: BP 139/69 (BP Location: Left Arm)    Pulse 74    Temp 97.9 F (36.6 C)    Resp 19    Ht '6\' 2"'  (1.88 m)    Wt 63.4 kg    SpO2 90%    BMI 17.95 kg/m  Pain Scale: 0-10 POSS *See Group Information*: S-Acceptable,Sleep, easy to arouse Pain Score: Asleep   SpO2: SpO2: 90 % O2 Device:SpO2: 90 % O2 Flow Rate: .O2 Flow Rate (L/min): 6 L/min  IO: Intake/output summary:   Intake/Output Summary (Last 24 hours) at 05/22/2018 1521 Last data filed at 05/22/2018 1453 Gross per 24 hour  Intake 431.55 ml  Output --  Net 431.55 ml    LBM: Last BM Date: 05/20/18 Baseline Weight: Weight: 61.2 kg Most recent weight: Weight: 63.4 kg     Palliative Assessment/Data:     Time In: 2:50 Time Out: 4:00 Time Total: 70 min Greater than 50%  of this time was spent counseling and coordinating care related to the above assessment and plan.  Signed by: Asencion Gowda, NP   Please contact Palliative Medicine Team phone at [phone] for questions and concerns.  For individual provider: See Shea Evans

## 2018-05-23 DIAGNOSIS — L89150 Pressure ulcer of sacral region, unstageable: Secondary | ICD-10-CM

## 2018-05-23 LAB — BASIC METABOLIC PANEL
Anion gap: 12 (ref 5–15)
BUN: 34 mg/dL — ABNORMAL HIGH (ref 8–23)
CO2: 24 mmol/L (ref 22–32)
Calcium: 8.8 mg/dL — ABNORMAL LOW (ref 8.9–10.3)
Chloride: 107 mmol/L (ref 98–111)
Creatinine, Ser: 0.93 mg/dL (ref 0.61–1.24)
GFR calc Af Amer: >60 mL/min (ref >60)
GFR calc non Af Amer: >60 mL/min (ref >60)
Glucose, Bld: 88 mg/dL (ref 70–99)
Potassium: 3.6 mmol/L (ref 3.5–5.1)
Sodium: 143 mmol/L (ref 135–145)

## 2018-05-23 LAB — CBC
HCT: 41.2 % (ref 39.0–52.0)
Hemoglobin: 13.3 g/dL (ref 13.0–17.0)
MCH: 31.6 pg (ref 26.0–34.0)
MCHC: 32.3 g/dL (ref 30.0–36.0)
MCV: 97.9 fL (ref 80.0–100.0)
Platelets: 283 10*3/uL (ref 150–400)
RBC: 4.21 MIL/uL — ABNORMAL LOW (ref 4.22–5.81)
RDW: 20.7 % — ABNORMAL HIGH (ref 11.5–15.5)
WBC: 13.9 10*3/uL — ABNORMAL HIGH (ref 4.0–10.5)
nRBC: 0.1 % (ref 0.0–0.2)

## 2018-05-23 NOTE — Care Management Important Message (Signed)
Important Message  Patient Details  Name: Donald Blanchard MRN: [id number] Date of Birth: [birth year]   Medicare Important Message Given:  Yes    Olegario Messier A Iyad Deroo 05/23/2018, 10:54 AM

## 2018-05-23 NOTE — Progress Notes (Signed)
[facility] - [place] [facility]   PATIENT NAME: Donald Blanchard    MR#:  [id number]  DATE OF BIRTH:  [birth year]  SUBJECTIVE:  CHIEF COMPLAINT:   Chief Complaint  Patient presents with  . Shortness of Breath  Patient seen and evaluated today Has shortness of breath and congestion and cough On six liter oxygen via nasal canula No fevers COVID-19 test negative  REVIEW OF SYSTEMS:    ROS  CONSTITUTIONAL: No documented fever. Has fatigue, weakness. No weight gain, no weight loss.  EYES: No blurry or double vision.  ENT: No tinnitus. No postnasal drip. No redness of the oropharynx.  RESPIRATORY: Has cough, no wheeze, no hemoptysis. Has dyspnea.  CARDIOVASCULAR: No chest pain. No orthopnea. No palpitations. No syncope.  GASTROINTESTINAL: No nausea, no vomiting or diarrhea. No abdominal pain. No melena or hematochezia.  GENITOURINARY: No dysuria or hematuria.  ENDOCRINE: No polyuria or nocturia. No heat or cold intolerance.  HEMATOLOGY: No anemia. No bruising. No bleeding.  INTEGUMENTARY: No rashes. No lesions.  MUSCULOSKELETAL: No arthritis. No swelling. No gout.  NEUROLOGIC: No numbness, tingling, or ataxia. No seizure-type activity.  PSYCHIATRIC: No anxiety. No insomnia. No ADD.   DRUG ALLERGIES:  No Known Allergies  VITALS:  Blood pressure 139/61, pulse 65, temperature 97.8 F (36.6 C), temperature source Oral, resp. rate 18, height 6\' 2"  (1.88 m), weight 61.7 kg, SpO2 (!) 81 %.  PHYSICAL EXAMINATION:   Physical Exam  GENERAL:  85 y.o.-year-old patient lying in the bed with no acute distress.  EYES: Pupils equal, round, reactive to light and accommodation. No scleral icterus. Extraocular muscles intact.  HEENT: Head atraumatic, normocephalic. Oropharynx and nasopharynx clear.  NECK:  Supple, no jugular venous distention. No thyroid enlargement, no tenderness.  LUNGS: Decreased breath sounds bilaterally, rales heard in right lung. No use of accessory  muscles of respiration.  CARDIOVASCULAR: S1, S2 normal. No murmurs, rubs, or gallops.  ABDOMEN: Soft, nontender, nondistended. Bowel sounds present. No organomegaly or mass.  EXTREMITIES: No cyanosis, clubbing or edema b/l.    NEUROLOGIC: Cranial nerves II through XII are intact. No focal Motor or sensory deficits b/l.   PSYCHIATRIC: The patient is alert and oriented x 3.  SKIN: No obvious rash, lesion, or ulcer.   LABORATORY PANEL:   CBC Recent Labs  Lab 05/23/18 0426  WBC 13.9*  HGB 13.3  HCT 41.2  PLT 283   ------------------------------------------------------------------------------------------------------------------ Chemistries  Recent Labs  Lab 05/20/18 0000  05/23/18 0426  NA 141   < > 143  K 4.1   < > 3.6  CL 106   < > 107  CO2 26   < > 24  GLUCOSE 107*   < > 88  BUN 23   < > 34*  CREATININE 1.01   < > 0.93  CALCIUM 8.9   < > 8.8*  AST 44*  --   --   ALT 10  --   --   ALKPHOS 90  --   --   BILITOT 0.9  --   --    < > = values in this interval not displayed.   ------------------------------------------------------------------------------------------------------------------  Cardiac Enzymes Recent Labs  Lab 05/20/18 0000  TROPONINI <0.03   ------------------------------------------------------------------------------------------------------------------  RADIOLOGY:  Dg Chest Port 1 View  Result Date: 05/21/2018 CLINICAL DATA:  Emphysema COPD EXAM: PORTABLE CHEST 1 VIEW COMPARISON:  05/20/2018, 09/30/2017, 01/04/2017, CT 01/04/2017 FINDINGS: Emphysematous disease, fibrosis and bronchiectasis. Increasing right pleural and parenchymal process in the right  thorax superimposed on underlying chronic disease. Reticular infiltrate at the left base, stable since 09/30/2017. Stable cardiomediastinal silhouette with aortic atherosclerosis. Left apically pleural and parenchymal scarring as before. IMPRESSION: 1. Further worsening of right pleural and parenchymal process  super imposed on underlying chronic lung disease. 2. Overall no significant change in appearance of left thorax with apically pleural and parenchymal chronic change and reticular opacity at the left base. Electronically Signed   By: Jasmine PangKim  Fujinaga M.D.   On: 05/21/2018 17:03     ASSESSMENT AND PLAN:   85 year old elderly male patient with history of COPD, sciatica, atrial fibrillation currently under hospitalist service for pneumonia  -Acute respiratory distress with hypoxia Improving slowly Continue oxygen via nasal cannula Monitor oxygen saturation Incentive spirometry  -Healthcare associated pneumonia Resolving slowly Has aspiration component Improving slowly MRSA PCR negative On IV zosyn abx Oxygen via nasal cannula COVID-19 test is negative Leukocytosis improving  -Hypertension Continue amlodipine for control of blood pressure  -Emphysema Inhaled steroids and albuterol inhalers  -Chronic atrial fibrillation In view of high risk for falls not on anticoagulation  -DVT prophylaxis subcu Lovenox daily  -Delirium/agitation Started oral Seroquel  -Risk of aspiration S/P speech therapy evaluation On Dysphagia 1 (puree) Nectar thick liquids  All the records are reviewed and case discussed with Care Management/Social Worker. Management plans discussed with the patient, family and they are in agreement.  CODE STATUS: DNR  DVT Prophylaxis: SCDs  TOTAL TIME TAKING CARE OF THIS PATIENT: 35 minutes.   POSSIBLE D/C IN 2 to 3 DAYS, DEPENDING ON CLINICAL CONDITION.  Ihor AustinPavan  M.D on 05/23/2018 at 10:18 AM  Between 7am to 6pm - Pager - [phone]  After 6pm go to [email or web address] - password [email or web address]  [facility]  Office  [phone]  CC: Primary care physician; Patient, No Pcp Per  Note: This dictation was prepared with Dragon dictation along with smaller phrase technology. Any transcriptional errors that result from this process are  unintentional.

## 2018-05-23 NOTE — Progress Notes (Addendum)
Daily Progress Note   Patient Name: Donald LeveringJohn Blanchard       Date: 05/23/2018 DOB: 09/28/1930  Age: 85 y.o. MRN#: [id number] Attending Physician: Ihor AustinPyreddy, Pavan, MD Primary Care Physician: Patient, No Pcp Per Admit Date: Dec 27, 2018  Reason for Consultation/Follow-up: Establishing goals of care  Subjective: Patient is resting in bed with eyes closed. He looks weak and frail. He turns his head and opens his eyes mometarily with verbal stimulus and says "hi". His voice sounds wet and he has a weak and congested cough. ? potential for silent aspiration with current diet;  per notes he does have a baseline cough . SLP is following.  Spoke with his daughter. She states she would not be interested in a MBS to determine safe swallowing if it were recommended by SLP. She states she is waiting for the primary provider to call and speak with her both for an update, and for prognostication.   She states she was planning to go see her child and new grandchild this weekend, but is afraid if she comes here first, she will not be permitted to see her grandchild because of exposure coming to the hospital here. She is questioning if Donald Blanchard can survive until Monday when she can return from her trip. Plans to attempt a tablet face time visit between the patient and daughter if able. Nursing director working on tablet issues.   Addendum: Nursing director working on [employer] for ability to provide face to face this evening, called daughter to update her on this. Nursing can provide the IPAD meeting as she would just like to see her father. She states she would like to speak with him if able.  He is sleeping at this time upon my entry, and she does not want to wake him for a phone call. She states she has spoken with the primary  MD and received updates, and he has answered her questions. She is considering the trip, and plans to call in the morning to check his status. She states she would not want BIPAP. We discussed possible rapid decline without BIPAP if one becomes indicated. She states "so if he needed it, he could die before I get there, I understand, it's good to know that, that helps." Responded that she is correct. No further questions, restates she will call to  check his status again in the morning to decide her plans.   Length of Stay: 3  Current Medications: Scheduled Meds:   amLODipine  2.5 mg Oral Daily   docusate sodium  100 mg Oral BID   enoxaparin (LOVENOX) injection  40 mg Subcutaneous Q24H   mouth rinse  15 mL Mouth Rinse BID   meloxicam  7.5 mg Oral Daily   mirtazapine  7.5 mg Oral QHS   multivitamin with minerals  1 tablet Oral Daily   pantoprazole  40 mg Oral Daily   QUEtiapine  12.5 mg Oral BID   tiotropium  18 mcg Inhalation Daily   traMADol  50 mg Oral BID   traZODone  25 mg Oral QHS   vitamin C  500 mg Oral Daily   zinc sulfate  220 mg Oral Daily    Continuous Infusions:  sodium chloride 50 mL/hr at 05/22/18 1453   piperacillin-tazobactam (ZOSYN)  IV 3.375 g (05/23/18 0444)    PRN Meds: acetaminophen **OR** acetaminophen, albuterol, ondansetron **OR** ondansetron (ZOFRAN) IV  Physical Exam Constitutional:      Comments: Opens eyes to voice. Voice and cough sounds wet and congested.              Vital Signs: BP 139/61    Pulse 65    Temp 97.8 F (36.6 C) (Oral)    Resp 18    Ht 6\' 2"  (1.88 m)    Wt 61.7 kg    SpO2 (!) 81%    BMI 17.46 kg/m  SpO2: SpO2: (!) 81 % O2 Device: O2 Device: Nasal Cannula O2 Flow Rate: O2 Flow Rate (L/min): 6 L/min  Intake/output summary:   Intake/Output Summary (Last 24 hours) at 05/23/2018 1224 Last data filed at 05/22/2018 1527 Gross per 24 hour  Intake 124.74 ml  Output --  Net 124.74 ml   LBM: Last BM Date:  05/22/18 Baseline Weight: Weight: 61.2 kg Most recent weight: Weight: 61.7 kg       Palliative Assessment/Data:    Flowsheet Rows     Most Recent Value  Intake Tab  Referral Department  Hospitalist  Unit at Time of Referral  Med/Surg Unit  Date Notified  05/22/18  Palliative Care Type  Return patient Palliative Care  Reason for referral  Clarify Goals of Care  Date of Admission  05/16/2018  Date first seen by Palliative Care  05/22/18  # of days Palliative referral response time  0 Day(s)  # of days IP prior to Palliative referral  3  Clinical Assessment  Psychosocial & Spiritual Assessment  Palliative Care Outcomes      Patient Active Problem List   Diagnosis Date Noted   Unstageable pressure ulcer of sacral region (HCC) 05/23/2018   HCAP (healthcare-associated pneumonia) 05/20/2018   Memory deficit 02/04/2018   Weakness generalized 02/04/2018   Memory loss 12/10/2017   Unsteady gait 12/10/2017   Anorexia 12/10/2017   Palliative care encounter 12/10/2017   Protein-calorie malnutrition, severe 10/02/2017   Pressure injury of skin 10/01/2017   Altered mental status 01/04/2017   Right upper lobe pneumonia (HCC) 01/08/2015   Emphysema lung (HCC) 01/08/2015   Leukocytosis 01/08/2015   Anemia 01/08/2015   Atrial fibrillation (HCC) 01/08/2015   Dementia with behavioral disturbance (HCC) 01/08/2015   Dysphagia 01/08/2015   Sepsis (HCC) 01/06/2015    Palliative Care Assessment & Plan    Recommendations/Plan:  Continue current care. Daughter wants to continue to treat the treatable at this time, and will reevaluate  Monday.    Code Status:    Code Status Orders  (From admission, onward)         Start     Ordered   05/20/18 0813  Do not attempt resuscitation (DNR)  Continuous    Question Answer Comment  In the event of cardiac or respiratory ARREST Do not call a code blue   In the event of cardiac or respiratory ARREST Do not perform  Intubation, CPR, defibrillation or ACLS   In the event of cardiac or respiratory ARREST Use medication by any route, position, wound care, and other measures to relive pain and suffering. May use oxygen, suction and manual treatment of airway obstruction as needed for comfort.   Comments NMP      05/20/18 0812        Code Status History    Date Active Date Inactive Code Status Order ID Comments User Context   09/30/2017 1545 10/04/2017 1618 DNR [id number]  Katha Hamming, MD ED   09/30/2017 1525 09/30/2017 1545 Full Code [id number]  Katha Hamming, MD ED   01/04/2017 1433 01/06/2017 1523 DNR [id number]  Houston Siren, MD ED   01/06/2015 2209 01/08/2015 1709 Full Code [id number]  Enid Baas, MD Inpatient    Advance Directive Documentation     Most Recent Value  Type of Advance Directive  Out of facility DNR (pink MOST or yellow form)  Pre-existing out of facility DNR order (yellow form or pink MOST form)  Yellow form placed in chart (order not valid for inpatient use)  "MOST" Form in Place?  --       Prognosis:  Poor  Discharge Planning:  To Be Determined   Thank you for allowing the Palliative Medicine Team to assist in the care of this patient.   Time In: 12:00 1:40 Time Out: 12:25 2:05 Total Time 25 min 25 min 50 min Prolonged Time Billed  no       Greater than 50%  of this time was spent counseling and coordinating care related to the above assessment and plan.  Morton Stall, NP  Please contact Palliative Medicine Team phone at [phone] for questions and concerns.

## 2018-05-24 LAB — CBC WITH DIFFERENTIAL/PLATELET
Abs Immature Granulocytes: 0.08 10*3/uL — ABNORMAL HIGH (ref 0.00–0.07)
Basophils Absolute: 0.1 10*3/uL (ref 0.0–0.1)
Basophils Relative: 0 %
Eosinophils Absolute: 0.2 10*3/uL (ref 0.0–0.5)
Eosinophils Relative: 2 %
HCT: 39.7 % (ref 39.0–52.0)
Hemoglobin: 12.5 g/dL — ABNORMAL LOW (ref 13.0–17.0)
Immature Granulocytes: 1 %
Lymphocytes Relative: 9 %
Lymphs Abs: 1.2 10*3/uL (ref 0.7–4.0)
MCH: 31.5 pg (ref 26.0–34.0)
MCHC: 31.5 g/dL (ref 30.0–36.0)
MCV: 100 fL (ref 80.0–100.0)
Monocytes Absolute: 1.7 10*3/uL — ABNORMAL HIGH (ref 0.1–1.0)
Monocytes Relative: 14 %
Neutro Abs: 9.3 10*3/uL — ABNORMAL HIGH (ref 1.7–7.7)
Neutrophils Relative %: 74 %
Platelets: 260 10*3/uL (ref 150–400)
RBC: 3.97 MIL/uL — ABNORMAL LOW (ref 4.22–5.81)
RDW: 20.9 % — ABNORMAL HIGH (ref 11.5–15.5)
WBC: 12.5 10*3/uL — ABNORMAL HIGH (ref 4.0–10.5)
nRBC: 0 % (ref 0.0–0.2)

## 2018-05-24 LAB — BASIC METABOLIC PANEL
Anion gap: 8 (ref 5–15)
BUN: 32 mg/dL — ABNORMAL HIGH (ref 8–23)
CO2: 26 mmol/L (ref 22–32)
Calcium: 8.8 mg/dL — ABNORMAL LOW (ref 8.9–10.3)
Chloride: 112 mmol/L — ABNORMAL HIGH (ref 98–111)
Creatinine, Ser: 1.02 mg/dL (ref 0.61–1.24)
GFR calc Af Amer: >60 mL/min (ref >60)
GFR calc non Af Amer: >60 mL/min (ref >60)
Glucose, Bld: 86 mg/dL (ref 70–99)
Potassium: 3.9 mmol/L (ref 3.5–5.1)
Sodium: 146 mmol/L — ABNORMAL HIGH (ref 135–145)

## 2018-05-24 MED ORDER — MORPHINE SULFATE (PF) 2 MG/ML IV SOLN
1.0000 mg | INTRAVENOUS | Status: DC | PRN
  Administered 2018-05-24 – 2018-05-26 (×4): 1 mg via INTRAVENOUS
  Filled 2018-05-24 (×4): qty 1

## 2018-05-24 NOTE — Progress Notes (Signed)
[employer] - [place] at [facility]   PATIENT NAME: Donald Blanchard    MR#:  [id number]  DATE OF BIRTH:  [birth year]  SUBJECTIVE:   Chief Complaint  Patient presents with  . Shortness of Breath   Patient found lying in bed with eyes closed. He is able to respond to verbal and tactile stimuli. He look deconditioned, frail and weak.He is not able to follow commands at this time but was able to tell me his name. He drifts easily back to sleep.   Patient is currently on oxygen at 4L via nasal cannula. He is mouth breathing but in no acute distress.   REVIEW OF SYSTEMS:  Unable to obtain from patient due to current altered mental status and lethargy  DRUG ALLERGIES:  No Known Allergies VITALS:  Blood pressure (!) 119/57, pulse 62, temperature (!) 97.5 F (36.4 C), temperature source Oral, resp. rate 16, height 6\' 2"  (1.88 m), weight 62.1 kg, SpO2 96 %. PHYSICAL EXAMINATION:   GENERAL:  85 y.o.-year-old patient lying in the bed with no acute distress.  EYES: Pupils equal, round, reactive to light and accommodation. No scleral icterus. Extraocular muscles intact.  HEENT: Head atraumatic, normocephalic. Oropharynx and nasopharynx clear.  NECK:  Supple, no jugular venous distention. No thyroid enlargement, no tenderness.  LUNGS: Normal breath sounds bilaterally, no wheezing, rales,rhonchi or crepitation. No use of accessory muscles of respiration.  CARDIOVASCULAR: S1, S2 normal. No murmurs, rubs, or gallops.  ABDOMEN: Soft, nontender, nondistended. Bowel sounds present. No organomegaly or mass.  EXTREMITIES: No pedal edema, cyanosis, or clubbing.  NEUROLOGIC: Cranial nerves II through XII are intact. Muscle strength 5/5 in all extremities. Sensation intact. Gait not checked.  PSYCHIATRIC: The patient is alert and oriented x 3.  SKIN: No obvious rash, lesion, or ulcer.   DATA REVIEWED:  LABORATORY PANEL:  Male CBC Recent Labs  Lab 05/24/18 0554  WBC 12.5*  HGB  12.5*  HCT 39.7  PLT 260   ------------------------------------------------------------------------------------------------------------------ Chemistries  Recent Labs  Lab 05/20/18 0000  05/24/18 0554  NA 141   < > 146*  K 4.1   < > 3.9  CL 106   < > 112*  CO2 26   < > 26  GLUCOSE 107*   < > 86  BUN 23   < > 32*  CREATININE 1.01   < > 1.02  CALCIUM 8.9   < > 8.8*  AST 44*  --   --   ALT 10  --   --   ALKPHOS 90  --   --   BILITOT 0.9  --   --    < > = values in this interval not displayed.   RADIOLOGY:  No results found. ASSESSMENT AND PLAN:   85 y.o. male with history of COPD, sciatica, atrial fibrillation presenting with cough and increased oxygen requirement. Patient is usually on 2L of oxygen via nasal cannula at the assisted living.  1. Acute on chronic hypoxic respiratory failure secondary to pneumonia and possible AECOPD? - Oxygenation improved - Supplemental O2, goal sat 88-92 - Bronchodilators (albuterol/ipratropium) standing and PRN - Palliative/hospice care following  2. HCAP - CXR shows superimposed acute right upper lobe and lower lobe consolidation. There were some concerns for possible aspiration as well. - Leukocytosis improving, check labs in am - MRSA and COVID neg - Continue IV Zosyn - Speech following - Dysphagia diet - Maintain HOB elevated - IVFS.  3. Chronic Atrial Fibrillation - Not anticoagulated  due to high risk for falls and bleed  4. Hypertension - Well controlled Continue Norvasc  5. Agitation - Likely Delirium - Avoid Benzos due to paradoxical worsening of agitation - Continue Seroquel low dose as needed   6. DVT prophylaxis - Lovenox  Discussed with patient's daughter and Son in-law (Dr. Arlana Pouchate) regarding goals of care. At this time family would like to proceed with comfort care.  All the records are reviewed and case discussed with Care Management/Social Worker. Management plans discussed with the patient, family and they are in  agreement.  CODE STATUS: DNR  TOTAL TIME TAKING CARE OF THIS PATIENT: 40 minutes.   More than 50% of the time was spent in counseling/coordination of care: YES  POSSIBLE D/C IN 2 DAYS, DEPENDING ON CLINICAL CONDITION.   on 05/24/2018 at 2:53 PM  This patient was staffed with Dr. Altamese DillingVachhani, Vaibhavkumar who personally evaluated patient, reviewed documentation and agreed with assessment and plan of care as above.  Donald SilversmithElizabeth Irais Mottram, DNP, FNP-BC [facility] Nurse Practitioner   Between 7am to 6pm - Pager [phone]- [phone]  After 6pm go to [email or web address] - Social research officer, governmentpassword EPAS [email or web address]  [facility] [place] [facility]  Office  [phone]  CC: Primary care physician; Patient, No Pcp Per  Note: This dictation was prepared with Dragon dictation along with smaller phrase technology. Any transcriptional errors that result from this process are unintentional.

## 2018-05-24 NOTE — Progress Notes (Signed)
Previously spoke with the pt's daughter, Aram Beecham via telephone call [phone]; advised her based upon the pt's orders and status of Comfort Measures, the pt may have up to 4 visitors per the COVID-19 visitor policy; the daughter advised that she would be up shortly

## 2018-05-24 NOTE — Progress Notes (Signed)
I spoke to patient's daughter and son-in-law ( Dr.Tate) to update about the condition and prognosis. We had long discussion about patient's gradual worsening situation for last few months at his facility.  Having repeated episodes of aspirations and requiring oxygens in the past. He also discussed about him being malnourished and being at high risk of recurrent infections going forward due to aspirations. Family understands his very poor prognosis and agreed on comfort care and involving hospice in further arrangements. I will inform social worker about this decisions and as per them he is living facility-[facility], cannot make any decision for next 2 to 3 days as the administrative staff is off due to holiday weekend. I spoke to nursing in charge on the floor to allow visitation by family members in the hospital as patient is on comfort care now. Orders for comfort care entered. Total time spent in this goals of care conversation he is 35 minutes

## 2018-05-25 LAB — CULTURE, BLOOD (ROUTINE X 2)
Culture: NO GROWTH
Culture: NO GROWTH
Special Requests: ADEQUATE
Special Requests: ADEQUATE

## 2018-05-25 NOTE — Progress Notes (Signed)
[employer] - [place] at [facility]   PATIENT NAME: Donald Blanchard    MR#:  [id number]  DATE OF BIRTH:  1930-12-29  SUBJECTIVE:   Chief Complaint  Patient presents with  . Shortness of Breath   Patient found lying in bed awake and more alert today. He is able to verbalized and responds correctly to questions. He look deconditioned, frail and weak.   Patient is currently on oxygen at 4L via nasal cannula. He is mouth breathing but in no acute distress.   REVIEW OF SYSTEMS:  Unable to obtain from patient due to current altered mental status and lethargy  DRUG ALLERGIES:  No Known Allergies VITALS:  Blood pressure 124/67, pulse 78, temperature 97.7 F (36.5 C), resp. rate 16, height 6\' 2"  (1.88 m), weight 62.1 kg, SpO2 (!) 85 %. PHYSICAL EXAMINATION:   GENERAL:  85 y.o.-year-old patient lying in the bed with no acute distress.  EYES: Pupils equal, round, reactive to light and accommodation. No scleral icterus. Extraocular muscles intact.  HEENT: Head atraumatic, normocephalic. Oropharynx and nasopharynx clear.  NECK:  Supple, no jugular venous distention. No thyroid enlargement, no tenderness.  LUNGS: Normal breath sounds bilaterally, no wheezing, rales,rhonchi or crepitation. No use of accessory muscles of respiration.  CARDIOVASCULAR: S1, S2 normal. No murmurs, rubs, or gallops.  ABDOMEN: Soft, nontender, nondistended. Bowel sounds present. No organomegaly or mass.  EXTREMITIES: No pedal edema, cyanosis, or clubbing.  NEUROLOGIC: Cranial nerves II through XII are intact. Muscle strength 5/5 in all extremities. Sensation intact. Gait not checked.  PSYCHIATRIC: The patient is alert and oriented x 3.  SKIN: No obvious rash, lesion, or ulcer.   DATA REVIEWED:  LABORATORY PANEL:  Male CBC Recent Labs  Lab 05/24/18 0554  WBC 12.5*  HGB 12.5*  HCT 39.7  PLT 260    ------------------------------------------------------------------------------------------------------------------ Chemistries  Recent Labs  Lab 05/20/18 0000  05/24/18 0554  NA 141   < > 146*  K 4.1   < > 3.9  CL 106   < > 112*  CO2 26   < > 26  GLUCOSE 107*   < > 86  BUN 23   < > 32*  CREATININE 1.01   < > 1.02  CALCIUM 8.9   < > 8.8*  AST 44*  --   --   ALT 10  --   --   ALKPHOS 90  --   --   BILITOT 0.9  --   --    < > = values in this interval not displayed.   RADIOLOGY:  No results found. ASSESSMENT AND PLAN:   85 y.o. male with history of COPD, sciatica, atrial fibrillation presenting with cough and increased oxygen requirement. Patient is usually on 2L of oxygen via nasal cannula at the assisted living.  1. Acute on chronic hypoxic respiratory failure secondary to pneumonia and possible AECOPD? - Oxygenation improved - Supplemental O2, goal sat 88-92 - Palliative/hospice care following  2. HCAP - CXR shows superimposed acute right upper lobe and lower lobe consolidation. There were some concerns for possible aspiration as well. - Leukocytosis improving, - MRSA and COVID neg - Speech following - Dysphagia diet - Maintain HOB elevated -Currently not treating as patient is on comfort care  3. Chronic Atrial Fibrillation - Not anticoagulated due to high risk for falls and bleed  4. Hypertension - Meds discontinued as patient is on comfort care  5. Agitation - Likely Delirium, he is however noted to be  calm without reports of agitation  6. DVT prophylaxis - Lovenox  Patient now is on comfort care measures per family wishes pending discharge to assisted living with hospice.  All the records are reviewed and case discussed with Care Management/Social Worker. Management plans discussed with the patient, family and they are in agreement.  CODE STATUS: DNR  TOTAL TIME TAKING CARE OF THIS PATIENT: 40 minutes.   More than 50% of the time was spent in  counseling/coordination of care: YES  POSSIBLE D/C IN 2 DAYS, DEPENDING ON CLINICAL CONDITION.   on 05/25/2018 at 2:25 PM  This patient was staffed with Dr. Altamese DillingVachhani, Vaibhavkumar who personally evaluated patient, reviewed documentation and agreed with assessment and plan of care as above.  Webb SilversmithElizabeth Herchel Hopkin, DNP, FNP-BC [facility] Nurse Practitioner   Between 7am to 6pm - Pager [phone]- [phone]  After 6pm go to [email or web address] - Social research officer, governmentpassword EPAS [facility]  [facility] [place] [facility]  Office  [phone]  CC: Primary care physician; Patient, No Pcp Per  Note: This dictation was prepared with Dragon dictation along with smaller phrase technology. Any transcriptional errors that result from this process are unintentional.

## 2018-05-26 MED ORDER — LORAZEPAM 2 MG/ML IJ SOLN: 0.5000 mg | Freq: Four times a day (QID) | INTRAMUSCULAR | Status: DC | PRN

## 2018-05-26 MED ORDER — MORPHINE SULFATE (PF) 2 MG/ML IV SOLN
1.0000 mg | INTRAVENOUS | Status: DC | PRN
  Administered 2018-05-26: 1 mg via INTRAVENOUS
  Filled 2018-05-26: qty 1

## 2018-05-26 MED ORDER — GLYCOPYRROLATE 0.2 MG/ML IJ SOLN
0.2000 mg | INTRAMUSCULAR | Status: DC | PRN
  Filled 2018-05-26: qty 1

## 2018-06-02 NOTE — Progress Notes (Signed)
   2018-05-31 1400  Clinical Encounter Type  Visited With Family  Visit Type Initial;Death  Referral From Nurse  Spiritual Encounters  Spiritual Needs Prayer;Emotional;Grief support  Stress Factors  Patient Stress Factors Loss  Pt expired. Daughter, Son-in-law, granddaughter were present. Daughter shared about pt's life and thanked the ch for offering a listening ear. The death was expected and family is dealing with their grief in a calm and orderly manner. Daughter deals with grief by sharing stories and son-in-law by staying present. Daughter asked for a prayer so ch prayed with the family.

## 2018-06-02 NOTE — Progress Notes (Signed)
This nurse called to room by patient's family. Patient's time of death 1340.  Pronounced by this nurse and Lucendia Herrlich.  Gregary Cromer and Dr. Elisabeth Pigeon made aware.  COPA reference [id number], spoke with Windy Carina.  Orson Ape, BSN

## 2018-06-02 NOTE — Progress Notes (Signed)
SLP Cancellation Note  Patient Details Name: Donald Blanchard MRN: [id number] DOB: 07-Sep-1930   Cancelled treatment:       Reason Eval/Treat Not Completed: Fatigue/lethargy limiting ability to participate; Chart reviewed. Nsg consulted. Noted pt is now comfort care. Nsg reports pt is not currently taking any PO's.  Overall status has declined.  Please continue with strict aspiration precautions, continue Dysphagia I diet w/Nectar thick liquids and continue to give meds crushed in puree. SLP to sign off for now, please notify SLP with any future change in status requiring reassessment.    Lorne Winkels, MA, CCC-SLP 06-17-18, 9:19 AM

## 2018-06-02 NOTE — Progress Notes (Addendum)
Daily Progress Note   Patient Name: Donald LeveringJohn Blanchard       Date: 05/27/2018 DOB: 06/19/1930  Age: 85 y.o. MRN#: [id number] Attending Physician: Altamese DillingVachhani, Vaibhavkumar, * Primary Care Physician: Patient, No Pcp Per Admit Date: 02/17/18  Reason for Consultation/Follow-up: Establishing goals of care  Subjective: Patient is resting in bed with eyes closed. He responds to my voice by attempting to speak. Unable to understand him. He appears comfortable. Extremities  are moderately warm. He is currently comfort care with meds in place. Will add Robinul for excessive secretions and Ativan for anxiety, both as needed for comfort. Plans to transfer to hospice facility tomorrow.   Family intermittently at bedside.   ADDENDUM:  Returned to bedside when family arrived. Patient's color is more pale and he no longer responds. He appears comfortable. Family playing hymns on the phone. Anticipated hospital death. Family states they will remain at bedside as death is immanent. All questions answered.    Length of Stay: 6  Current Medications: Scheduled Meds:  . mouth rinse  15 mL Mouth Rinse BID    Continuous Infusions:   PRN Meds: acetaminophen **OR** acetaminophen, albuterol, morphine injection, ondansetron **OR** ondansetron (ZOFRAN) IV  Physical Exam Constitutional:      Comments: Opens eyes to voice. Voice and cough sounds wet and congested.   Pulmonary:     Effort: Pulmonary effort is normal.  Musculoskeletal:     Comments: Moderately warm             Vital Signs: BP 130/73 (BP Location: Right Arm)   Pulse 93   Temp (!) 97.4 F (36.3 C) (Axillary)   Resp (!) 22   Ht 6\' 2"  (1.88 m)   Wt 62.1 kg   SpO2 (!) 63%   BMI 17.58 kg/m  SpO2: SpO2: (!) 63 % O2 Device: O2 Device: Nasal  Cannula O2 Flow Rate: O2 Flow Rate (L/min): 3 L/min  Intake/output summary:   Intake/Output Summary (Last 24 hours) at 05/19/2018 1043 Last data filed at 05/22/2018 0500 Gross per 24 hour  Intake 140 ml  Output 375 ml  Net -235 ml   LBM: Last BM Date: 05/22/18 Baseline Weight: Weight: 61.2 kg Most recent weight: Weight: 62.1 kg       Palliative Assessment/Data:    Flowsheet Rows     Most  Recent Value  Intake Tab  Referral Department  Hospitalist  Unit at Time of Referral  Med/Surg Unit  Date Notified  05/22/18  Palliative Care Type  Return patient Palliative Care  Reason for referral  Clarify Goals of Care  Date of Admission  05/29/2018  Date first seen by Palliative Care  05/22/18  # of days Palliative referral response time  0 Day(s)  # of days IP prior to Palliative referral  3  Clinical Assessment  Psychosocial & Spiritual Assessment  Palliative Care Outcomes      Patient Active Problem List   Diagnosis Date Noted  . Unstageable pressure ulcer of sacral region (HCC) 05/23/2018  . HCAP (healthcare-associated pneumonia) 05/20/2018  . Memory deficit 02/04/2018  . Weakness generalized 02/04/2018  . Memory loss 12/10/2017  . Unsteady gait 12/10/2017  . Anorexia 12/10/2017  . Palliative care encounter 12/10/2017  . Protein-calorie malnutrition, severe 10/02/2017  . Pressure injury of skin 10/01/2017  . Altered mental status 01/04/2017  . Right upper lobe pneumonia (HCC) 01/08/2015  . Emphysema lung (HCC) 01/08/2015  . Leukocytosis 01/08/2015  . Anemia 01/08/2015  . Atrial fibrillation (HCC) 01/08/2015  . Dementia with behavioral disturbance (HCC) 01/08/2015  . Dysphagia 01/08/2015  . Sepsis (HCC) 01/06/2015    Palliative Care Assessment & Plan    Recommendations/Plan:  Comfort care. Awaiting transfer tomorrow.    Code Status:    Code Status Orders  (From admission, onward)         Start     Ordered   05/20/18 0813  Do not attempt resuscitation  (DNR)  Continuous    Question Answer Comment  In the event of cardiac or respiratory ARREST Do not call a "code blue"   In the event of cardiac or respiratory ARREST Do not perform Intubation, CPR, defibrillation or ACLS   In the event of cardiac or respiratory ARREST Use medication by any route, position, wound care, and other measures to relive pain and suffering. May use oxygen, suction and manual treatment of airway obstruction as needed for comfort.   Comments NMP      05/20/18 0812        Code Status History    Date Active Date Inactive Code Status Order ID Comments User Context   09/30/2017 1545 10/04/2017 1618 DNR [id number]  Katha Hamming, MD ED   09/30/2017 1525 09/30/2017 1545 Full Code [id number]  Katha Hamming, MD ED   01/04/2017 1433 01/06/2017 1523 DNR [id number]  Houston Siren, MD ED   01/06/2015 2209 01/08/2015 1709 Full Code [id number]  Enid Baas, MD Inpatient    Advance Directive Documentation     Most Recent Value  Type of Advance Directive  Out of facility DNR (pink MOST or yellow form)  Pre-existing out of facility DNR order (yellow form or pink MOST form)  Yellow form placed in chart (order not valid for inpatient use)  "MOST" Form in Place?  -       Prognosis:  Poor  Discharge Planning:  To Be Determined   Thank you for allowing the Palliative Medicine Team to assist in the care of this patient.   Total Time 35 min Prolonged Time Billed  no       Greater than 50%  of this time was spent counseling and coordinating care related to the above assessment and plan.  Morton Stall, NP  Please contact Palliative Medicine Team phone at [phone] for questions and concerns.

## 2018-06-02 NOTE — TOC Progression Note (Signed)
Transition of Care [facility]) - Progression Note    Patient Details  Name: Donald Blanchard MRN: [id number] Date of Birth: Apr 05, 1930  [facility] CM/SW Contact  Ruthe Mannan, [place] Phone Number: 06/20/2018, 10:12 AM  Clinical Narrative: CSW spoke with staff at [facility] regarding discharge. MD wanted to discharge patient back to facility today. Per [facility] staff, they can not accept patient today due to being a holiday and management staff are not available. CSW notified MD of above. CSW will continue to follow for discharge planning.       Expected Discharge Plan: Assisted Living Barriers to Discharge: Continued Medical Work up  Expected Discharge Plan and Services Expected Discharge Plan: Assisted Living       Living arrangements for the past 2 months: Assisted Living Facility                                       Social Determinants of Health (SDOH) Interventions    Readmission Risk Interventions No flowsheet data found.

## 2018-06-02 NOTE — Care Management Important Message (Signed)
Important Message  Patient Details  Name: Donald Blanchard MRN: [id number] Date of Birth: Jun 30, 1930   Medicare Important Message Given:  Yes    Johnell Comings 06/13/2018, 10:28 AM

## 2018-06-02 NOTE — Discharge Summary (Signed)
Date of death-06/25/2018  Cause of death-pneumonia  Other contributing factors-COPD, atrial fibrillation  Presentation to hospital-   The patient with past medical history of COPD and atrial fibrillation presents to the emergency department from his nursing home due to cough and increased oxygen requirement.  The patient is usually on 2 L of oxygen via nasal cannula and required 4 L to maintain oxygen saturations greater than 94%.  Chest x-ray as an outpatient and in the emergency department showed multifocal pneumonia.  At the time of arrival the patient was groggy and unable to provide history.  By the time this examiner interviewed the patient he was clearheaded and denied pain.  He does not believe that he has pneumonia but admits to a wet cough.  Denies chest pain.  The patient was started on IV antibiotics prior to the emergency department staff called the hospitalist service for admission.  [facility] course  85 y.o. male with history of COPD, sciatica, atrial fibrillation presenting with cough and increased oxygen requirement. Patient is usually on 2L of oxygen via nasal cannula at the assisted living.  1. Acute on chronic hypoxic respiratory failure secondary to pneumonia and possible AECOPD? - Palliative/hospice care following  2. HCAP - CXR shows superimposed acute right upper lobe and lower lobe consolidation. There were some concerns for possible aspiration as well. - Maintain HOB elevated - Currently not treating as patient is on comfort care  3. Chronic Atrial Fibrillation - Not anticoagulated due to high risk for falls and bleed  4. Hypertension - Meds discontinued as patient is on comfort care  5. Agitation - Likely Delirium, he is however noted to be calm without reports of agitation  Due to recurrent presentations, after meeting with palliative care and discussion with family they agreed on comfort care and patient died in hospital.  For more details please see  progress notes done earlier the same day.

## 2018-06-02 NOTE — Progress Notes (Signed)
[employer] [facility] - [place] at [facility]   PATIENT NAME: Donald Blanchard    MR#:  [id number]  DATE OF BIRTH:  12/27/1930  SUBJECTIVE:   Chief Complaint  Patient presents with  . Shortness of Breath   Patient found lying in bed awake and more alert today. Patient is currently on oxygen at 4L via nasal cannula. He is mouth breathing but in no acute distress. He is still able to track and respond appropriately. Speech is somewhat mumbled but audible.  REVIEW OF SYSTEMS:  Unable to obtain from patient due to current altered mental status and lethargy  DRUG ALLERGIES:  No Known Allergies VITALS:  Blood pressure 130/73, pulse 93, temperature (!) 97.4 F (36.3 C), temperature source Axillary, resp. rate (!) 22, height 6\' 2"  (1.88 m), weight 62.1 kg, SpO2 (!) 63 %. PHYSICAL EXAMINATION:   GENERAL:  85 y.o.-year-old patient lying in the bed with no acute distress.  EYES: Pupils equal, round, reactive to light and accommodation. No scleral icterus. Extraocular muscles intact.  HEENT: Head atraumatic, normocephalic. Oropharynx and nasopharynx clear.  NECK:  Supple, no jugular venous distention. No thyroid enlargement, no tenderness.  LUNGS: Normal breath sounds bilaterally, no wheezing, rales,rhonchi or crepitation. No use of accessory muscles of respiration.  CARDIOVASCULAR: S1, S2 normal. No murmurs, rubs, or gallops.  ABDOMEN: Soft, nontender, nondistended. Bowel sounds present. No organomegaly or mass.  EXTREMITIES: No pedal edema, cyanosis, or clubbing.  NEUROLOGIC: Cranial nerves II through XII are intact. Muscle strength 5/5 in all extremities. Sensation intact. Gait not checked.  PSYCHIATRIC: The patient is alert and oriented x 3.  SKIN: No obvious rash, lesion, or ulcer.   DATA REVIEWED:  LABORATORY PANEL:  Male CBC Recent Labs  Lab 05/24/18 0554  WBC 12.5*  HGB 12.5*  HCT 39.7  PLT 260    ------------------------------------------------------------------------------------------------------------------ Chemistries  Recent Labs  Lab 05/20/18 0000  05/24/18 0554  NA 141   < > 146*  K 4.1   < > 3.9  CL 106   < > 112*  CO2 26   < > 26  GLUCOSE 107*   < > 86  BUN 23   < > 32*  CREATININE 1.01   < > 1.02  CALCIUM 8.9   < > 8.8*  AST 44*  --   --   ALT 10  --   --   ALKPHOS 90  --   --   BILITOT 0.9  --   --    < > = values in this interval not displayed.   RADIOLOGY:  No results found. ASSESSMENT AND PLAN:   85 y.o. male with history of COPD, sciatica, atrial fibrillation presenting with cough and increased oxygen requirement. Patient is usually on 2L of oxygen via nasal cannula at the assisted living.  1. Acute on chronic hypoxic respiratory failure secondary to pneumonia and possible AECOPD? - Palliative/hospice care following  2. HCAP - CXR shows superimposed acute right upper lobe and lower lobe consolidation. There were some concerns for possible aspiration as well. - Maintain HOB elevated - Currently not treating as patient is on comfort care  3. Chronic Atrial Fibrillation - Not anticoagulated due to high risk for falls and bleed  4. Hypertension - Meds discontinued as patient is on comfort care  5. Agitation - Likely Delirium, he is however noted to be calm without reports of agitation  Patient now is on comfort care measures with plan to discharge to hospice home. Family  in agreement with plan  All the records are reviewed and case discussed with Care Management/Social Worker. Management plans discussed with the patient, family and they are in agreement.  CODE STATUS: DNR  TOTAL TIME TAKING CARE OF THIS PATIENT: 40 minutes.   More than 50% of the time was spent in counseling/coordination of care: YES  POSSIBLE D/C IN 2 DAYS, DEPENDING ON CLINICAL CONDITION.   on 06/16/2018 at 1:36 PM  This patient was staffed with Dr. Altamese Dilling who  personally evaluated patient, reviewed documentation and agreed with assessment and plan of care as above.  Webb Silversmith, DNP, FNP-BC [facility] Nurse Practitioner   Between 7am to 6pm - Pager [phone]  After 6pm go to [email or web address] - Social research officer, government  [facility]  Office  [phone]  CC: Primary care physician; Patient, No Pcp Per  Note: This dictation was prepared with Dragon dictation along with smaller phrase technology. Any transcriptional errors that result from this process are unintentional.

## 2018-06-02 DEATH — deceased

## 2019-06-20 IMAGING — CT CT CERVICAL SPINE W/O CM
4 of 9 series · 9 of 33 positions shown, 10 images · non-contrast
Comparison: CT head 01/07/2017

CLINICAL DATA: Unwitnessed fall, found on floor, abrasion to top of
head, LEFT hip pain

EXAM:
CT HEAD WITHOUT CONTRAST
CT CERVICAL SPINE WITHOUT CONTRAST
TECHNIQUE: Multidetector CT imaging of the head and cervical spine was
performed following the standard protocol without intravenous
contrast. Multiplanar CT image reconstructions of the cervical spine
were also generated.

[Series 9: c spine soft · axial · 0.33mm/px · z∈[-203,-131]mm · 2 of 92 slices shown]
[im 31/92  soft-tissue]
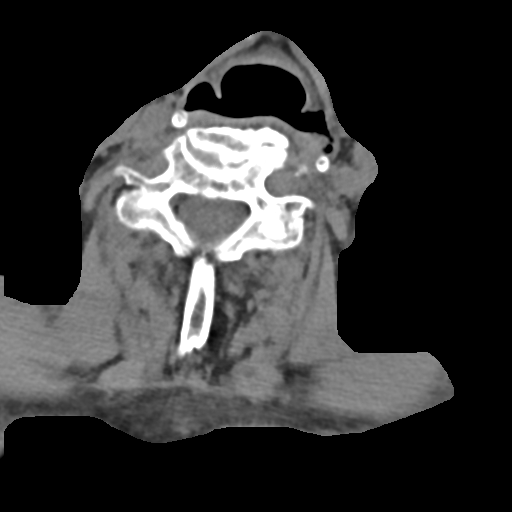
[im 61/92  soft-tissue]
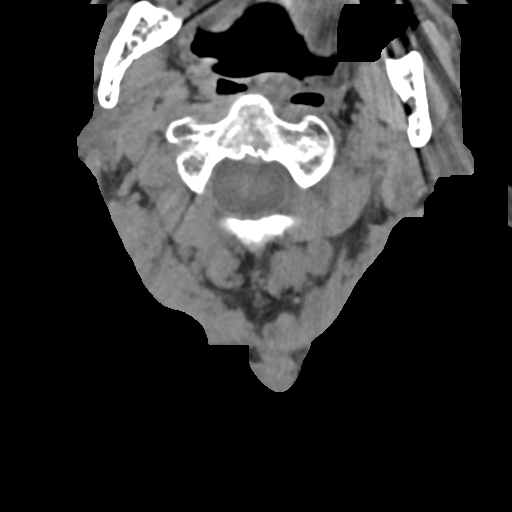

[Series 10: sagittal bone · sagittal · 0.36mm/px · 4 of 46 slices shown]
[im 10/46  bone]
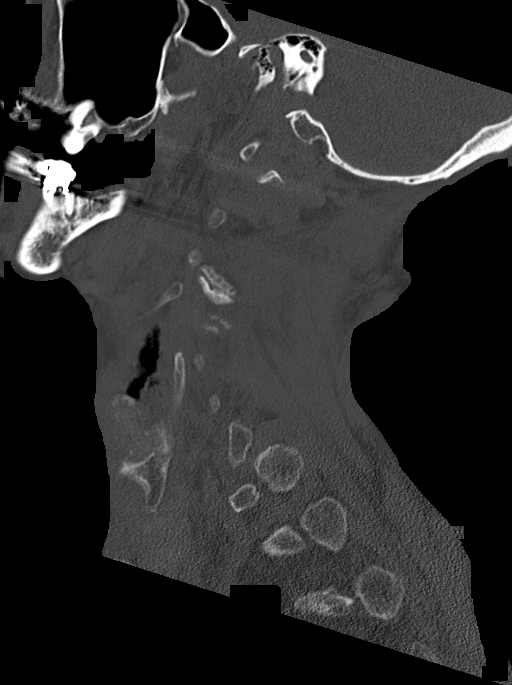
[im 19/46  bone]
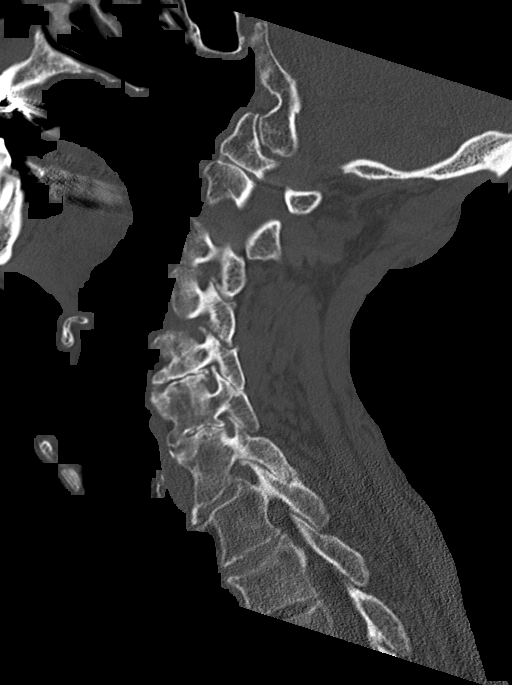
[im 28/46  bone]
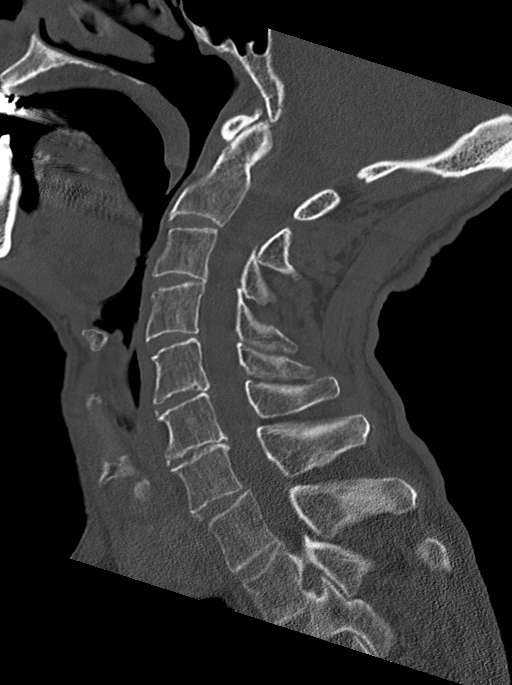
[im 37/46  bone]
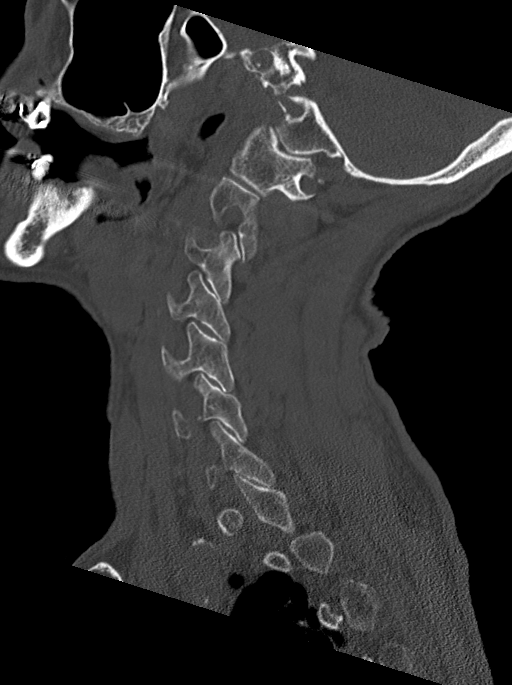

[Series 11: coronal bone · coronal · 0.33mm/px · 1 of 69 slices shown]
[im 35/69  bone]
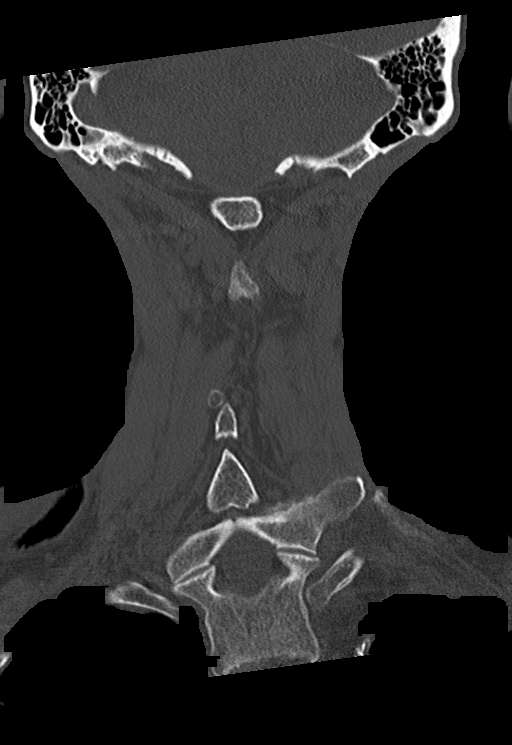

[Series 12: orthogonal bone · axial · 0.24mm/px · z∈[-215,-146]mm · 2 of 105 slices shown, 3 images]
[im 35/105  soft-tissue]
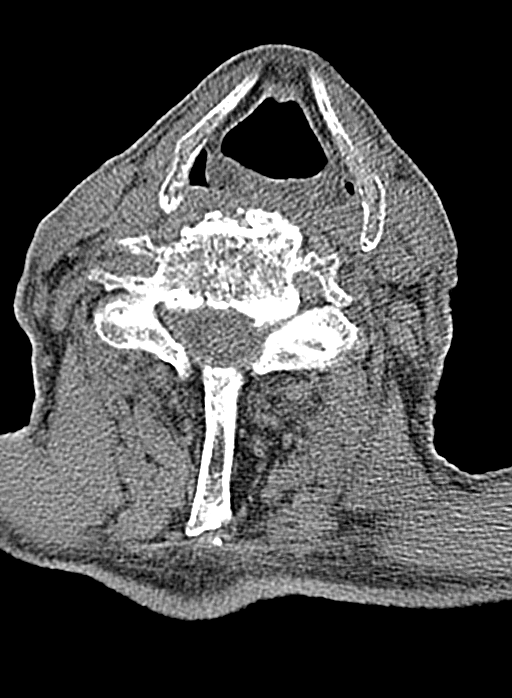
[im 35/105  bone]
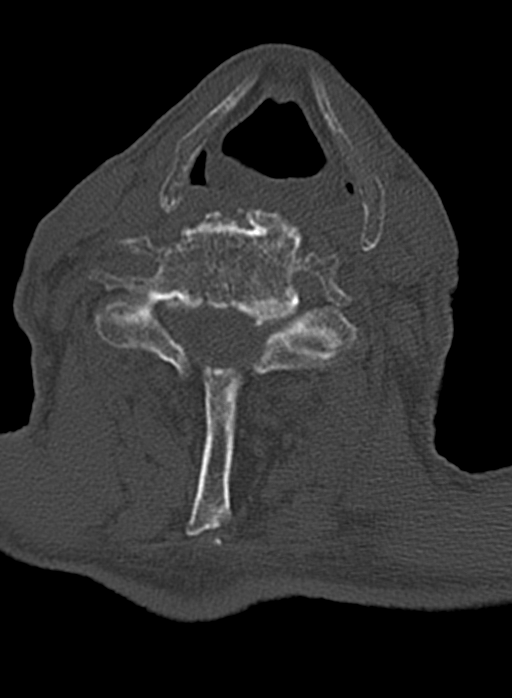
[im 70/105  bone]
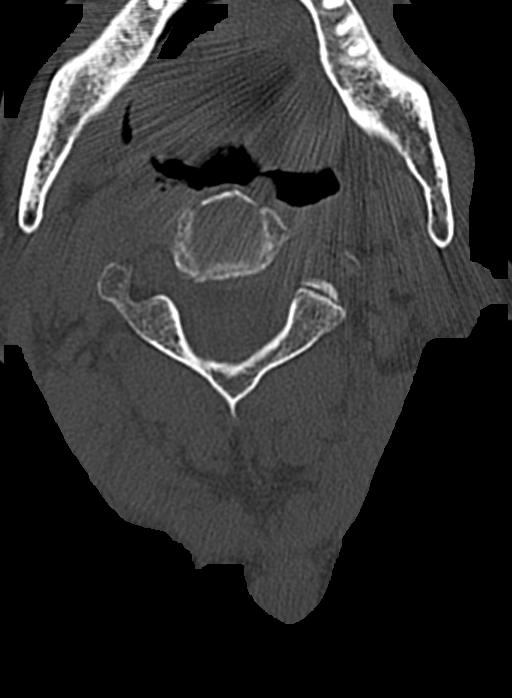

[9 of 33 positions shown; findings below may reference images not displayed]

FINDINGS: CT HEAD FINDINGS

Brain: Generalized atrophy. Normal ventricular morphology. No
midline shift or mass effect. Small vessel chronic ischemic changes
of deep cerebral white matter. No intracranial hemorrhage, mass
lesion, evidence of acute infarction, or extra-axial fluid
collection.

Vascular: Atherosclerotic calcifications of internal carotid and
vertebral arteries at skull base

Skull: Demineralized but intact

Sinuses/Orbits: Clear

Other: N/A

CT CERVICAL SPINE FINDINGS

Alignment: Normal

Skull base and vertebrae: Diffuse osseous demineralization.
Visualized skull base intact. Vertebral body heights maintained.
Multilevel facet degenerative changes. No acute fracture,
subluxation, or bone destruction.

Soft tissues and spinal canal: Prevertebral soft tissues normal
thickness. Tiny nonspecific RIGHT thyroid nodule. Visualize cervical
soft tissues otherwise unremarkable.

Disc levels: Mild scattered disc space narrowing and minimal
endplate spur formation. Encroachment upon cervical neural foramina
at C5-C6 and C6-C7 by uncovertebral spurs greater on LEFT.

Upper chest: Biapical emphysematous changes and parenchymal lung
scarring.

Other: N/A
IMPRESSION: Atrophy with small vessel chronic ischemic changes of deep cerebral
white matter.

No acute intracranial abnormalities.

Degenerative disc and facet disease changes cervical spine.

No acute cervical spine abnormalities.

Biapical lung scarring and emphysematous changes.

## 2019-12-27 IMAGING — DX DG CHEST 1V PORT
1 series · 1 of 1 positions shown · non-contrast
Comparison: Portable exam 7787 hours compared to 01/04/2017

CLINICAL DATA: Increased weakness and falls over past weak, history
dementia, atrial fibrillation, COPD, emphysema

EXAM:
PORTABLE CHEST 1 VIEW

[chest ap]
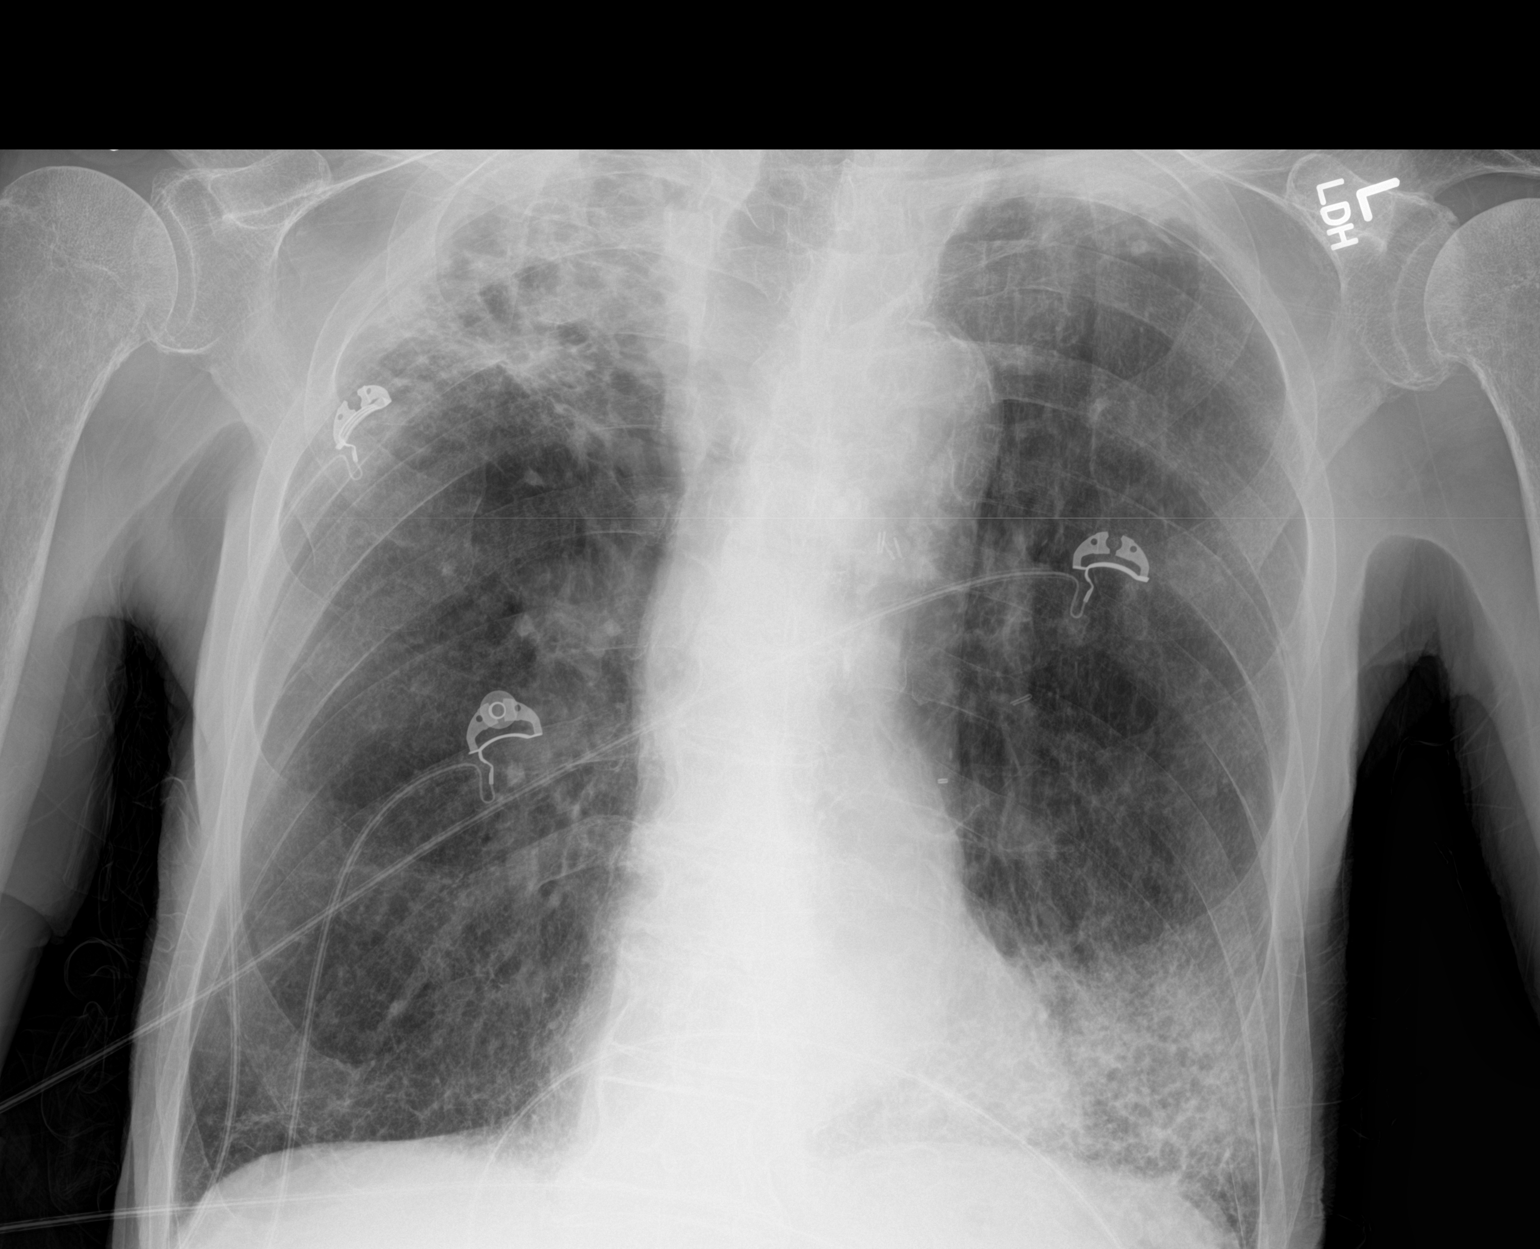

[1 of 1 positions shown; findings below may reference images not displayed]

FINDINGS: Normal heart size, mediastinal contours, and pulmonary vascularity.

Progressive scarring and volume loss at both apices greater on
RIGHT.

Progressive interstitial lung disease changes at LEFT base versus
superimposed acute infiltrate.

Stable chronic interstitial disease at RIGHT base.

Underlying emphysematous changes.

No pleural effusion, pneumothorax or acute osseous findings.
IMPRESSION: COPD changes with biapical scarring RIGHT greater than LEFT.

Increased LEFT basilar infiltrate question progressing of chronic
interstitial lung disease versus superimposed pneumonia.

## 2020-08-15 IMAGING — DX PORTABLE CHEST - 1 VIEW
1 series · 1 of 1 positions shown · non-contrast
Comparison: 09/03/2017 and earlier.

CLINICAL DATA: 87-year-old male with cough and hypoxia.

EXAM:
PORTABLE CHEST 1 VIEW

[chest ap]
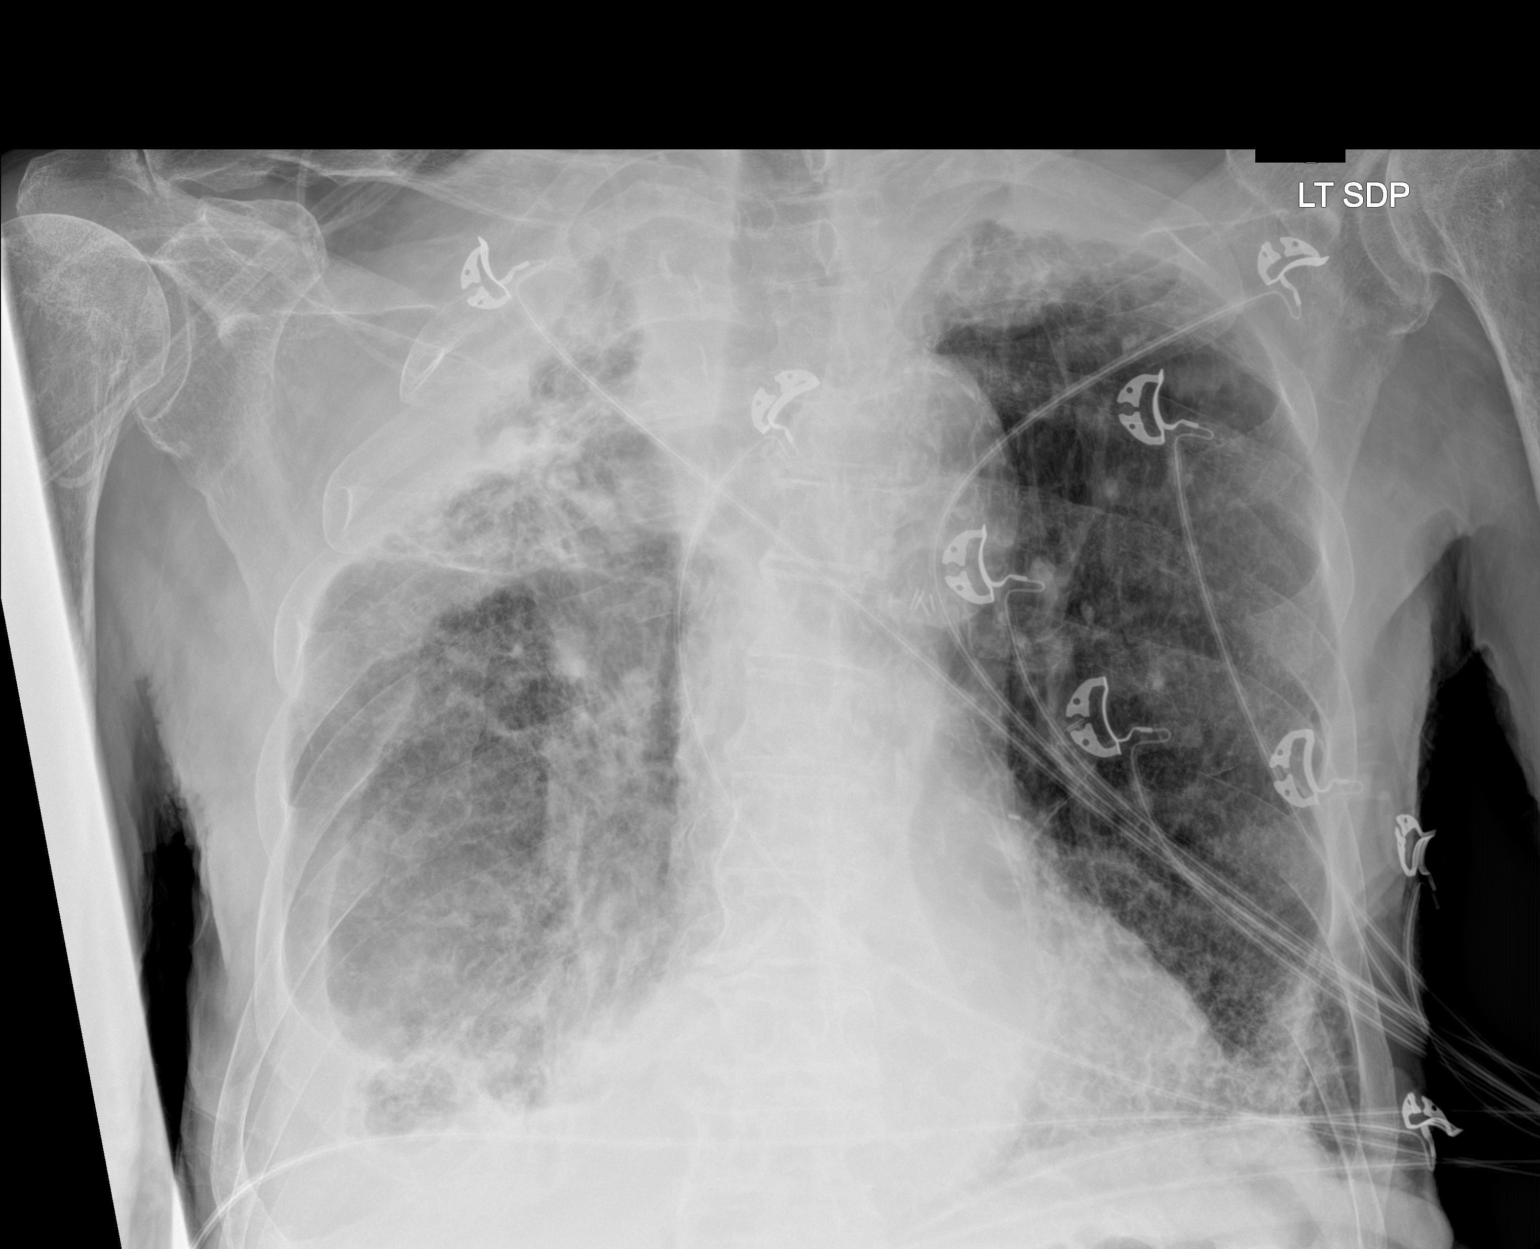

[1 of 1 positions shown; findings below may reference images not displayed]

FINDINGS: Portable AP upright view at 9992 hours. Chronic severe upper lung
disease with bronchiectasis, architectural distortion as seen by CT
in 8958. Increased superimposed consolidation/opacification of the
right upper lobe since [REDACTED]. Similar new confluent opacity at
the right lung base. Underlying coarse bilateral pulmonary
interstitial opacity, with stable honeycombing at the left lung
base.

Stable cardiac size and mediastinal contours. Visualized tracheal
air column is within normal limits. No pneumothorax. Possible small
right pleural effusion.
IMPRESSION: Severe chronic lung disease with superimposed acute right upper lobe
and lower lobe consolidation suspicious for multifocal acute
infectious exacerbation. Possible small right pleural effusion.

## 2020-08-16 IMAGING — DX PORTABLE CHEST - 1 VIEW
1 series · 1 of 1 positions shown · non-contrast
Comparison: 05/20/2018, 09/30/2017, 01/04/2017, CT 01/04/2017

CLINICAL DATA: Emphysema COPD

EXAM:
PORTABLE CHEST 1 VIEW

[chest ap]
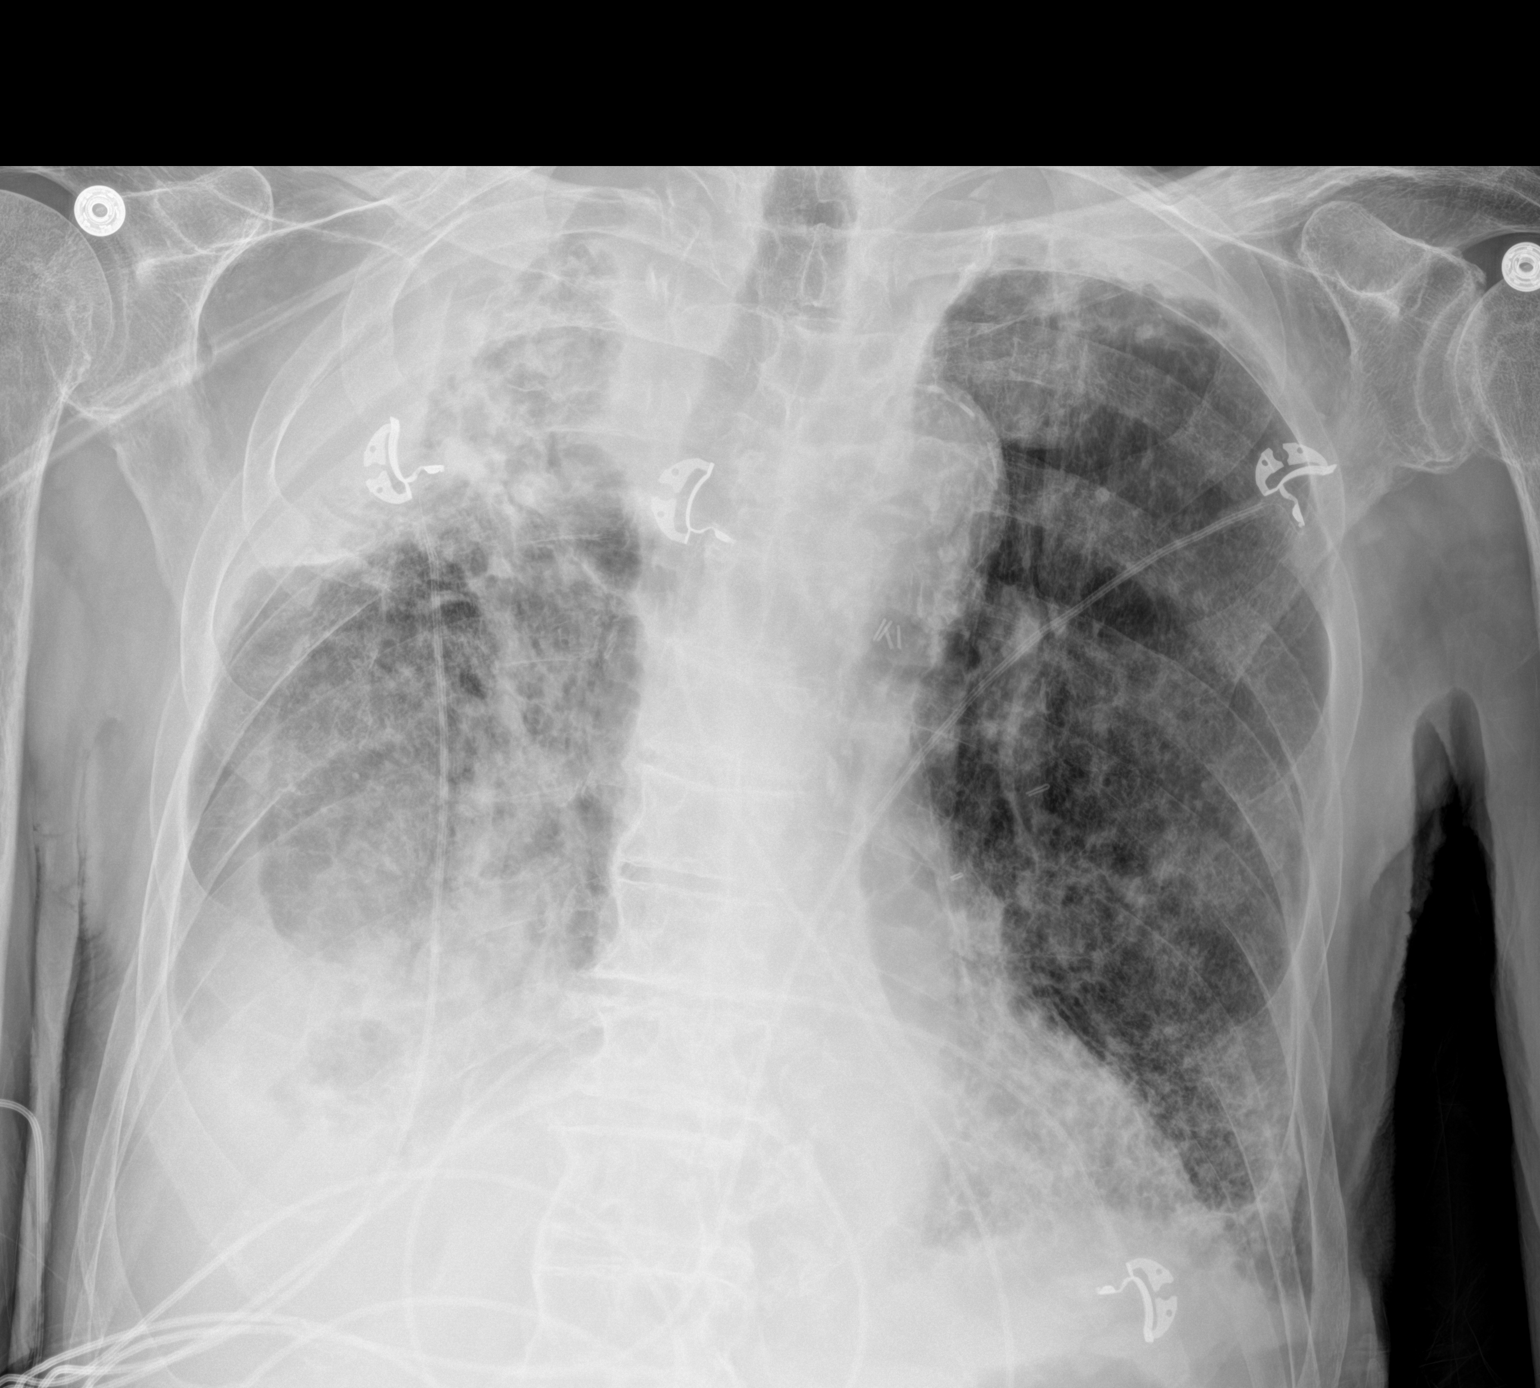

[1 of 1 positions shown; findings below may reference images not displayed]

FINDINGS: Emphysematous disease, fibrosis and bronchiectasis. Increasing right
pleural and parenchymal process in the right thorax superimposed on
underlying chronic disease. Reticular infiltrate at the left base,
stable since 09/30/2017. Stable cardiomediastinal silhouette with
aortic atherosclerosis. Left apically pleural and parenchymal
scarring as before.
IMPRESSION: 1. Further worsening of right pleural and parenchymal process super
imposed on underlying chronic lung disease.
2. Overall no significant change in appearance of left thorax with
apically pleural and parenchymal chronic change and reticular
opacity at the left base.
